# Patient Record
Sex: Female | Born: 1975 | Race: White | Hispanic: No | Marital: Single | Smoking: Never smoker
Health system: Southern US, Community
[De-identification: ages and names within clinical notes are randomized; demographics above are authoritative.]

## PROBLEM LIST (undated history)

## (undated) DIAGNOSIS — I10 Essential (primary) hypertension: Secondary | ICD-10-CM

## (undated) DIAGNOSIS — IMO0002 Reserved for concepts with insufficient information to code with codable children: Secondary | ICD-10-CM

## (undated) DIAGNOSIS — M329 Systemic lupus erythematosus, unspecified: Secondary | ICD-10-CM

---

## 2004-07-17 ENCOUNTER — Emergency Department: Payer: Self-pay | Admitting: Internal Medicine

## 2006-12-15 ENCOUNTER — Emergency Department: Payer: Self-pay | Admitting: Emergency Medicine

## 2007-06-14 ENCOUNTER — Emergency Department: Payer: Self-pay | Admitting: Emergency Medicine

## 2007-06-16 ENCOUNTER — Ambulatory Visit: Payer: Self-pay | Admitting: General Surgery

## 2007-06-20 ENCOUNTER — Ambulatory Visit: Payer: Self-pay | Admitting: General Surgery

## 2007-09-08 ENCOUNTER — Emergency Department: Payer: Self-pay | Admitting: Emergency Medicine

## 2007-09-14 ENCOUNTER — Emergency Department: Payer: Self-pay | Admitting: Emergency Medicine

## 2007-10-07 ENCOUNTER — Emergency Department: Payer: Self-pay | Admitting: Emergency Medicine

## 2008-03-07 ENCOUNTER — Observation Stay: Payer: Self-pay

## 2008-03-13 ENCOUNTER — Observation Stay: Payer: Self-pay | Admitting: Obstetrics and Gynecology

## 2008-05-06 ENCOUNTER — Ambulatory Visit: Payer: Self-pay | Admitting: Obstetrics and Gynecology

## 2008-05-07 ENCOUNTER — Inpatient Hospital Stay: Payer: Self-pay | Admitting: Obstetrics and Gynecology

## 2008-10-09 ENCOUNTER — Ambulatory Visit: Payer: Self-pay | Admitting: Family Medicine

## 2009-07-16 ENCOUNTER — Emergency Department: Payer: Self-pay | Admitting: Emergency Medicine

## 2011-12-08 ENCOUNTER — Ambulatory Visit: Payer: Self-pay | Admitting: Internal Medicine

## 2013-06-20 ENCOUNTER — Ambulatory Visit: Payer: Self-pay | Admitting: Physician Assistant

## 2014-10-28 ENCOUNTER — Ambulatory Visit: Payer: Self-pay | Admitting: Family Medicine

## 2014-11-05 ENCOUNTER — Ambulatory Visit: Payer: Self-pay | Admitting: Family Medicine

## 2014-11-25 ENCOUNTER — Ambulatory Visit: Payer: Self-pay | Admitting: Family Medicine

## 2014-12-24 ENCOUNTER — Ambulatory Visit: Payer: Self-pay | Admitting: Physician Assistant

## 2016-02-09 ENCOUNTER — Other Ambulatory Visit: Payer: Self-pay | Admitting: Family Medicine

## 2016-02-09 DIAGNOSIS — Z1231 Encounter for screening mammogram for malignant neoplasm of breast: Secondary | ICD-10-CM

## 2016-02-12 ENCOUNTER — Ambulatory Visit
Admission: RE | Admit: 2016-02-12 | Discharge: 2016-02-12 | Disposition: A | Discharge status: Home / Self Care | Payer: BLUE CROSS/BLUE SHIELD | Source: Ambulatory Visit | Attending: Family Medicine | Admitting: Family Medicine

## 2016-02-12 DIAGNOSIS — Z1231 Encounter for screening mammogram for malignant neoplasm of breast: Secondary | ICD-10-CM

## 2016-03-10 ENCOUNTER — Other Ambulatory Visit: Payer: Self-pay | Admitting: Family Medicine

## 2016-03-10 DIAGNOSIS — R928 Other abnormal and inconclusive findings on diagnostic imaging of breast: Secondary | ICD-10-CM

## 2016-03-19 ENCOUNTER — Other Ambulatory Visit: Payer: BLUE CROSS/BLUE SHIELD

## 2016-03-19 ENCOUNTER — Ambulatory Visit: Payer: BLUE CROSS/BLUE SHIELD

## 2016-04-05 ENCOUNTER — Ambulatory Visit
Admission: RE | Admit: 2016-04-05 | Discharge: 2016-04-05 | Disposition: A | Discharge status: Home / Self Care | Payer: BLUE CROSS/BLUE SHIELD | Source: Ambulatory Visit | Attending: Family Medicine | Admitting: Family Medicine

## 2016-04-05 DIAGNOSIS — R928 Other abnormal and inconclusive findings on diagnostic imaging of breast: Secondary | ICD-10-CM

## 2017-12-13 ENCOUNTER — Other Ambulatory Visit: Payer: Self-pay | Admitting: Family Medicine

## 2017-12-13 DIAGNOSIS — Z1231 Encounter for screening mammogram for malignant neoplasm of breast: Secondary | ICD-10-CM

## 2017-12-20 ENCOUNTER — Ambulatory Visit
Admission: RE | Admit: 2017-12-20 | Discharge: 2017-12-20 | Disposition: A | Discharge status: Home / Self Care | Payer: BLUE CROSS/BLUE SHIELD | Source: Ambulatory Visit | Attending: Family Medicine | Admitting: Family Medicine

## 2017-12-20 DIAGNOSIS — R928 Other abnormal and inconclusive findings on diagnostic imaging of breast: Secondary | ICD-10-CM | POA: Diagnosis not present

## 2017-12-20 DIAGNOSIS — Z1231 Encounter for screening mammogram for malignant neoplasm of breast: Secondary | ICD-10-CM | POA: Diagnosis present

## 2017-12-27 ENCOUNTER — Other Ambulatory Visit: Payer: Self-pay | Admitting: Family Medicine

## 2017-12-27 DIAGNOSIS — N6489 Other specified disorders of breast: Secondary | ICD-10-CM

## 2017-12-27 DIAGNOSIS — R928 Other abnormal and inconclusive findings on diagnostic imaging of breast: Secondary | ICD-10-CM

## 2018-01-10 ENCOUNTER — Ambulatory Visit
Admission: RE | Admit: 2018-01-10 | Discharge: 2018-01-10 | Disposition: A | Discharge status: Home / Self Care | Payer: BLUE CROSS/BLUE SHIELD | Source: Ambulatory Visit | Attending: Family Medicine | Admitting: Family Medicine

## 2018-01-10 DIAGNOSIS — R928 Other abnormal and inconclusive findings on diagnostic imaging of breast: Secondary | ICD-10-CM | POA: Diagnosis present

## 2018-01-10 DIAGNOSIS — N6489 Other specified disorders of breast: Secondary | ICD-10-CM

## 2019-11-16 ENCOUNTER — Ambulatory Visit
Admission: EM | Admit: 2019-11-16 | Discharge: 2019-11-16 | Disposition: A | Discharge status: Home / Self Care | Payer: BC Managed Care – PPO | Attending: Urgent Care | Admitting: Urgent Care

## 2019-11-16 ENCOUNTER — Other Ambulatory Visit: Payer: Self-pay

## 2019-11-16 ENCOUNTER — Encounter: Payer: Self-pay | Admitting: Emergency Medicine

## 2019-11-16 DIAGNOSIS — H66003 Acute suppurative otitis media without spontaneous rupture of ear drum, bilateral: Secondary | ICD-10-CM

## 2019-11-16 DIAGNOSIS — R059 Cough, unspecified: Secondary | ICD-10-CM

## 2019-11-16 DIAGNOSIS — R05 Cough: Secondary | ICD-10-CM

## 2019-11-16 HISTORY — DX: Reserved for concepts with insufficient information to code with codable children: IMO0002

## 2019-11-16 HISTORY — DX: Systemic lupus erythematosus, unspecified: M32.9

## 2019-11-16 MED ORDER — BENZONATATE 200 MG PO CAPS: 200.0000 mg | ORAL_CAPSULE | Freq: Three times a day (TID) | ORAL | 0 refills | Status: DC | PRN

## 2019-11-16 MED ORDER — AMOXICILLIN-POT CLAVULANATE 875-125 MG PO TABS: 1.0000 | ORAL_TABLET | Freq: Two times a day (BID) | ORAL | 0 refills | Status: AC

## 2019-11-16 NOTE — Discharge Instructions (Addendum)
It was very nice seeing you today in clinic. Thank you for entrusting me with your care.   Rest and Stay HYDRATED. Water and electrolyte containing beverages (Gatorade, Pedialyte) are best to prevent dehydration and electrolyte abnormalities. Use medications as prescribed. May use Tylenol and/or Ibuprofen as needed for pain/fever.   Make arrangements to follow up with your regular doctor in 1 week for re-evaluation if not improving. If your symptoms/condition worsens, please seek follow up care either here or in the ER. Please remember, our [place] [facility] providers are "right here with you" when you need us.   Again, it was my pleasure to take care of you today. Thank you for choosing our clinic. I hope that you start to feel better quickly.   Leevi Cullars, MSN, APRN, FNP-C, CEN Advanced Practice Provider [facility] [place] [facility] 

## 2019-11-16 NOTE — ED Triage Notes (Addendum)
Patient c/o left ear pain that started yesterday. Patient c/o cough for a week.  Patient was tested for COVID and was negative.  Patient denies fevers.

## 2019-11-17 NOTE — ED Provider Notes (Signed)
[place], [place]   Name: Melissa Best DOB: 07-01-1976 MRN: [id number] CSN: [id number] PCP: Rayetta Humphrey, MD  Arrival date and time:  11/16/19 1505  Chief Complaint:  Otalgia (left) and Cough   NOTE: Prior to seeing the patient today, I have reviewed the triage nursing documentation and vital signs. Clinical staff has updated patient's PMH/PSHx, current medication list, and drug allergies/intolerances to ensure comprehensive history available to assist in medical decision making.   History:   HPI: Melissa Best is a 44 y.o. female who presents today with complaints of fatigue, cough, upper back pain, generalized headache, and LEFT sided otalgia that started approximately 1 week ago. Patient denies fevers.  She has not experienced any otorrhea or changes to her hearing.  Pain in her ears is worse on the left side as compared to the right.  She is complaining of pain in the LEFT side of her neck.  Cough has been non-productive with no associated shortness of breath or wheezing.  Cough is reportedly significantly worse at night. She denies that she has experienced any nausea, vomiting, diarrhea, or abdominal pain. She is eating and drinking well. Patient denies any perceived alterations to her sense of taste or smell. Patient denies being in close contact with anyone known to be ill; no one else is her home has experienced a similar symptom constellation. She has been tested for SARS-CoV-2 (novel coronavirus) in the past 14 days; last tested negative on 11/07/2019 per her report. Patient has not been vaccinated for influenza this season. In efforts to conservatively manage her symptoms at home, the patient notes that she has used naproxen, which has not helped to improve her symptoms.    Past Medical History:  Diagnosis Date  . Lupus [facility])     Past Surgical History:  Procedure Laterality Date  . CESAREAN SECTION      Family History  Problem Relation Age of  Onset  . Healthy Mother   . Healthy Father   . Breast cancer Neg Hx     Social History   Tobacco Use  . Smoking status: Never Smoker  . Smokeless tobacco: Never Used  Substance Use Topics  . Alcohol use: Never  . Drug use: Never    There are no problems to display for this patient.   Home Medications:    Current Meds  Medication Sig  . Calcium Carbonate-Vitamin D 600-200 MG-UNIT TABS Take by mouth.  . cetirizine (ZYRTEC) 10 MG tablet Take by mouth.  . cyanocobalamin 1000 MCG tablet Take by mouth.  . hydrochlorothiazide (HYDRODIURIL) 25 MG tablet Take by mouth.  . hydroxychloroquine (PLAQUENIL) 200 MG tablet Take by mouth.  Marland Kitchen ipratropium (ATROVENT) 0.03 % nasal spray Place into the nose.  . naproxen (NAPROSYN) 500 MG tablet Take by mouth.  . SUMAtriptan (IMITREX) 50 MG tablet TAKE 1 TABLET BY MOUTH ONCE AS NEEDED FOR MIGRAINE. MAY TAKE A 2ND DOSE AFTER 2 HOURS IF NEEDED.    Allergies:   Hydrocodone and Other  Review of Systems (ROS): Review of Systems  Constitutional: Positive for fatigue. Negative for fever.  HENT: Positive for ear pain. Negative for congestion, postnasal drip, rhinorrhea, sinus pressure, sinus pain, sneezing and sore throat.   Eyes: Negative for pain, discharge and redness.  Respiratory: Negative for cough, chest tightness and shortness of breath.   Cardiovascular: Negative for chest pain and palpitations.  Gastrointestinal: Negative for abdominal pain, diarrhea, nausea and vomiting.  Musculoskeletal: Positive for back pain. Negative for arthralgias,  myalgias and neck pain.  Skin: Negative for color change, pallor and rash.  Allergic/Immunologic: Positive for immunocompromised state (Lupus).  Neurological: Positive for headaches. Negative for dizziness, syncope and weakness.  Hematological: Negative for adenopathy.     Vital Signs: Today's Vitals   11/16/19 1515 11/16/19 1519 11/16/19 1550  BP:  (S) (!) 183/107   Pulse:  74   Resp:  14     Temp:  97.8 F (36.6 C)   TempSrc:  Oral   SpO2:  100%   Weight: 145 lb (65.8 kg)    Height: 4\' 11"  (1.499 m)    PainSc: 9   9     Physical Exam: Physical Exam  Constitutional: She is oriented to person, place, and time and well-developed, well-nourished, and in no distress.  HENT:  Head: Normocephalic and atraumatic.  Right Ear: There is tenderness. Tympanic membrane is erythematous. A middle ear effusion (suppurative) is present.  Left Ear: There is tenderness. Tympanic membrane is erythematous. A middle ear effusion (suppurative) is present.  Mouth/Throat: Uvula is midline and mucous membranes are normal. Posterior oropharyngeal erythema present. No oropharyngeal exudate or posterior oropharyngeal edema.  Eyes: Pupils are equal, round, and reactive to light.  Cardiovascular: Normal rate, regular rhythm, normal heart sounds and intact distal pulses.  Pulmonary/Chest: Effort normal and breath sounds normal.  Significant cough noted in clinic. No SOB or increased WOB. No distress. Able to speak in complete sentences without difficulties. SPO2 100% on RA  Musculoskeletal:     Cervical back: Pain (2/2 coughing; reproduciable with coughing and deep inspiration) present.       Back:  Lymphadenopathy:       Head (left side): Submandibular adenopathy present.  Neurological: She is alert and oriented to person, place, and time. Gait normal.  Skin: Skin is warm and dry. No rash noted. She is not diaphoretic.  Psychiatric: Mood, memory, affect and judgment normal.  Nursing note and vitals reviewed.   Urgent Care Treatments / Results:   No orders of the defined types were placed in this encounter.   LABS: PLEASE NOTE: all labs that were ordered this encounter are listed, however only abnormal results are displayed. Labs Reviewed - No data to display  EKG: -None  RADIOLOGY: No results found.  PROCEDURES: Procedures  MEDICATIONS RECEIVED THIS VISIT: Medications - No data to  display  PERTINENT CLINICAL COURSE NOTES/UPDATES:   Initial Impression / Assessment and Plan / Urgent Care Course:  Pertinent labs & imaging results that were available during my care of the patient were personally reviewed by me and considered in my medical decision making (see lab/imaging section of note for values and interpretations).  Melissa Best is a 44 y.o. female who presents to [facility] today with complaints of Otalgia (left) and Cough  Patient overall well appearing and in no acute distress today in clinic. Presenting symptoms (see HPI) and exam as documented above.  Symptoms persistent for the last week despite conservative management at home.  Patient has been recently tested for SARS-CoV-2 6 11/07/2019) and the results were negative.  Patient does not wish to be retested today despite education regarding possibility of potential infection.  Exam reveals AOME and patient's BILATERAL ears.  Will proceed with treatment using a 10-day course of amoxicillin-clavulanate. Discussed supportive care measures at home during acute phase of illness. Patient to rest as much as possible. She was encouraged to ensure adequate hydration (water and ORS) to prevent dehydration and electrolyte derangements. Patient  may use APAP and/or IBU on an as needed basis for pain/fever.  Patient's cough is significant, and she is experiencing pleuritic chest pain associated with certain movements and deep inspiration.  Initially discussed sending a prescription for Tussionex cough syrup to help with both her cough and chest discomfort, however has an unclear allergy to hydrocodone, thus I will defer. Will send in a supply of benzonatate (Tessalon) for patient to use on a PRN basis to help with her cough.   Current clinical condition warrants patient being out of work in order to recover from current illness. She was provided with the appropriate documentation to provide to her place of employment  that will allow for her to RTW on 11/19/2019 with no restrictions.  Patient made aware that her RTW may be contingent on her providing documentation of a negative SARS-CoV-2 test.  Again, patient continues to decline retesting today.  Discussed follow up with primary care physician in 1 week for re-evaluation. I have reviewed the follow up and strict return precautions for any new or worsening symptoms. Patient is aware of symptoms that would be deemed urgent/emergent, and would thus require further evaluation either here or in the emergency department. At the time of discharge, she verbalized understanding and consent with the discharge plan as it was reviewed with her. All questions were fielded by provider and/or clinic staff prior to patient discharge.    Final Clinical Impressions / Urgent Care Diagnoses:   Final diagnoses:  Non-recurrent acute suppurative otitis media of both ears without spontaneous rupture of tympanic membranes  Cough    New Prescriptions:  [place] Controlled Substance Registry consulted? Not Applicable  Meds ordered this encounter  Medications  . amoxicillin-clavulanate (AUGMENTIN) 875-125 MG tablet    Sig: Take 1 tablet by mouth 2 (two) times daily for 10 days.    Dispense:  20 tablet    Refill:  0  . benzonatate (TESSALON) 200 MG capsule    Sig: Take 1 capsule (200 mg total) by mouth 3 (three) times daily as needed for cough.    Dispense:  15 capsule    Refill:  0    Recommended Follow up Care:  Patient encouraged to follow up with the following provider within the specified time frame, or sooner as dictated by the severity of her symptoms. As always, she was instructed that for any urgent/emergent care needs, she should seek care either here or in the emergency department for more immediate evaluation.  Follow-up Information    Rayetta Humphrey, MD In 1 week.   Specialty: Family Medicine Why: General reassessment of symptoms if not improving Contact  information: [address] [place] [address] [phone]         NOTE: This note was prepared using Dragon dictation software along with smaller phrase technology. Despite my best ability to proofread, there is the potential that transcriptional errors may still occur from this process, and are completely unintentional.    Verlee Monte, NP 11/17/19 1745

## 2020-01-16 ENCOUNTER — Other Ambulatory Visit: Payer: Self-pay | Admitting: Family Medicine

## 2020-01-16 DIAGNOSIS — R1084 Generalized abdominal pain: Secondary | ICD-10-CM

## 2020-01-16 DIAGNOSIS — R1011 Right upper quadrant pain: Secondary | ICD-10-CM

## 2020-06-25 ENCOUNTER — Other Ambulatory Visit: Payer: Self-pay | Admitting: Family Medicine

## 2020-06-25 DIAGNOSIS — Z1231 Encounter for screening mammogram for malignant neoplasm of breast: Secondary | ICD-10-CM

## 2020-07-03 ENCOUNTER — Other Ambulatory Visit: Payer: Self-pay

## 2020-07-03 ENCOUNTER — Ambulatory Visit
Admission: RE | Admit: 2020-07-03 | Discharge: 2020-07-03 | Disposition: A | Discharge status: Home / Self Care | Payer: BC Managed Care – PPO | Source: Ambulatory Visit | Attending: Family Medicine | Admitting: Family Medicine

## 2020-07-03 DIAGNOSIS — Z1231 Encounter for screening mammogram for malignant neoplasm of breast: Secondary | ICD-10-CM | POA: Diagnosis not present

## 2020-07-09 ENCOUNTER — Other Ambulatory Visit: Payer: Self-pay | Admitting: Family Medicine

## 2020-07-09 DIAGNOSIS — N6489 Other specified disorders of breast: Secondary | ICD-10-CM

## 2020-07-09 DIAGNOSIS — R928 Other abnormal and inconclusive findings on diagnostic imaging of breast: Secondary | ICD-10-CM

## 2020-08-06 ENCOUNTER — Telehealth: Payer: Self-pay | Admitting: *Deleted

## 2020-08-06 NOTE — Telephone Encounter (Signed)
NO RESPONSE FROM PT TO [facility] AV/ MAMMO - CERTIFIED LETTER SENT

## 2020-10-16 ENCOUNTER — Other Ambulatory Visit: Payer: Self-pay

## 2020-10-16 ENCOUNTER — Encounter: Payer: Self-pay | Admitting: Emergency Medicine

## 2020-10-16 ENCOUNTER — Ambulatory Visit
Admission: EM | Admit: 2020-10-16 | Discharge: 2020-10-16 | Disposition: A | Discharge status: Home / Self Care | Payer: BC Managed Care – PPO | Attending: Physician Assistant | Admitting: Physician Assistant

## 2020-10-16 DIAGNOSIS — H9202 Otalgia, left ear: Secondary | ICD-10-CM | POA: Diagnosis present

## 2020-10-16 DIAGNOSIS — R059 Cough, unspecified: Secondary | ICD-10-CM | POA: Insufficient documentation

## 2020-10-16 DIAGNOSIS — J0140 Acute pansinusitis, unspecified: Secondary | ICD-10-CM | POA: Insufficient documentation

## 2020-10-16 DIAGNOSIS — Z20822 Contact with and (suspected) exposure to covid-19: Secondary | ICD-10-CM | POA: Insufficient documentation

## 2020-10-16 DIAGNOSIS — I10 Essential (primary) hypertension: Secondary | ICD-10-CM | POA: Insufficient documentation

## 2020-10-16 MED ORDER — AMOXICILLIN-POT CLAVULANATE 875-125 MG PO TABS: 1.0000 | ORAL_TABLET | Freq: Two times a day (BID) | ORAL | 0 refills | Status: AC

## 2020-10-16 MED ORDER — BENZONATATE 200 MG PO CAPS: 200.0000 mg | ORAL_CAPSULE | Freq: Three times a day (TID) | ORAL | 0 refills | Status: DC | PRN

## 2020-10-16 NOTE — ED Triage Notes (Signed)
Patient c/o nasal congestion, cough and chest congestion that started 10/11/20.  Patient denies fevers.

## 2020-10-16 NOTE — Discharge Instructions (Addendum)
Recommend Augmentin 875mg  (antibiotic) twice a day as directed. May take Tessalon cough pills 1 every 8 hours as needed for cough. Continue Naproxen 500mg  every 12 hours as needed for pain. May also take OTC Tylenol (Acetaminophen) 1000mg  every 8 hours as needed for pain. Continue to increase fluids to help loosen up mucus in sinuses. Follow-up pending COVID 19 test results and in 4 to 5 days if not improving.

## 2020-10-16 NOTE — ED Provider Notes (Signed)
[facility]    CSN: [id number] Arrival date & time: 10/16/20  0949      History   Chief Complaint Chief Complaint  Patient presents with  . Cough  . Nasal Congestion    HPI Melissa Best is a 45 y.o. female.   45 year old female presents with nasal congestion, fatigue, and dry cough that started about 5 to 6 days ago. Over the past 2 to 3 days, symptoms have worsened with left sided sinus pain and pressure, maxillary sinus swelling, bloody nose and left ear pain. Also having a sore throat and chills but denies any known fever or GI symptoms. Has taken OTC sinus, Mucinex and Naproxen with minimal relief. Multiple people sick and positive for COVID 19 at her work. Other chronic health issues include HTN, environmental allergies, migraine headaches and Lupus. Currently on HCTZ, Zyrtec, Atrovent nasal spray, and Plaquenil daily and Imitrex prn.   The history is provided by the patient.    Past Medical History:  Diagnosis Date  . Lupus (HCC)     There are no problems to display for this patient.   Past Surgical History:  Procedure Laterality Date  . CESAREAN SECTION      OB History   No obstetric history on file.      Home Medications    Prior to Admission medications   Medication Sig Start Date End Date Taking? Authorizing Provider  amoxicillin-clavulanate (AUGMENTIN) 875-125 MG tablet Take 1 tablet by mouth every 12 (twelve) hours for 7 days. 10/16/20 10/23/20 Yes Elridge Stemm, Ali Lowe, NP  Calcium Carbonate-Vitamin D 600-200 MG-UNIT TABS Take by mouth. 05/17/19 10/16/20 Yes [provider]  cetirizine (ZYRTEC) 10 MG tablet Take by mouth. 11/28/18 10/16/20 Yes [provider]  cyanocobalamin 1000 MCG tablet Take by mouth. 01/31/19  Yes [provider]  hydrochlorothiazide (HYDRODIURIL) 25 MG tablet Take by mouth. 05/29/19 10/16/20 Yes [provider]  hydroxychloroquine (PLAQUENIL) 200 MG tablet Take by mouth. 05/17/19   Yes [provider]  SUMAtriptan (IMITREX) 50 MG tablet TAKE 1 TABLET BY MOUTH ONCE AS NEEDED FOR MIGRAINE. MAY TAKE A 2ND DOSE AFTER 2 HOURS IF NEEDED. 05/29/19  Yes [provider]  benzonatate (TESSALON) 200 MG capsule Take 1 capsule (200 mg total) by mouth 3 (three) times daily as needed for cough. 10/16/20   Sudie Grumbling, NP  ipratropium (ATROVENT) 0.03 % nasal spray Place into the nose. 08/29/19 08/28/20  [provider]  naproxen (NAPROSYN) 500 MG tablet Take by mouth. 10/09/19   [provider]    Family History Family History  Problem Relation Age of Onset  . Healthy Mother   . Healthy Father   . Breast cancer Neg Hx     Social History Social History   Tobacco Use  . Smoking status: Never Smoker  . Smokeless tobacco: Never Used  Vaping Use  . Vaping Use: Never used  Substance Use Topics  . Alcohol use: Never  . Drug use: Never     Allergies   Hydrocodone and Other   Review of Systems Review of Systems  Constitutional: Positive for appetite change, chills and fatigue. Negative for activity change and fever.  HENT: Positive for congestion, ear pain (left), facial swelling (left), nosebleeds, postnasal drip, rhinorrhea, sinus pressure, sinus pain and sore throat. Negative for ear discharge, mouth sores and trouble swallowing.   Eyes: Negative for pain, redness and itching.  Respiratory: Positive for cough. Negative for chest tightness, shortness of  breath and wheezing.   Gastrointestinal: Negative for nausea and vomiting.  Musculoskeletal: Positive for arthralgias, myalgias and neck pain (left side). Negative for neck stiffness.  Skin: Negative for color change and rash.  Allergic/Immunologic: Positive for environmental allergies. Negative for food allergies and immunocompromised state.  Neurological: Positive for headaches. Negative for dizziness, tremors, seizures, syncope, speech difficulty, weakness and numbness.   Hematological: Negative for adenopathy. Does not bruise/bleed easily.     Physical Exam Triage Vital Signs ED Triage Vitals  Enc Vitals Group     BP 10/16/20 1009 (!) 144/104     Pulse Rate 10/16/20 1009 71     Resp 10/16/20 1009 14     Temp 10/16/20 1009 98.4 F (36.9 C)     Temp Source 10/16/20 1009 Oral     SpO2 10/16/20 1009 100 %     Weight 10/16/20 1006 146 lb (66.2 kg)     Height 10/16/20 1006 4\' 11"  (1.499 m)     Head Circumference --      Peak Flow --      Pain Score 10/16/20 1005 8     Pain Loc --      Pain Edu? --      Excl. in GC? --    No data found.  Updated Vital Signs BP (!) 144/104 (BP Location: Left Arm)   Pulse 71   Temp 98.4 F (36.9 C) (Oral)   Resp 14   Ht 4\' 11"  (1.499 m)   Wt 146 lb (66.2 kg)   LMP 10/04/2020 (Exact Date)   SpO2 100%   BMI 29.49 kg/m   Visual Acuity Right Eye Distance:   Left Eye Distance:   Bilateral Distance:    Right Eye Near:   Left Eye Near:    Bilateral Near:     Physical Exam Vitals and nursing note reviewed.  Constitutional:      General: She is awake. She is not in acute distress.    Appearance: She is well-developed and well-groomed. She is ill-appearing.     Comments: She is sitting in the exam chair in no acute distress but appears tired, ill and in pain.   HENT:     Head: Normocephalic and atraumatic.     Right Ear: Hearing, ear canal and external ear normal. No drainage. No middle ear effusion. Tympanic membrane is bulging. Tympanic membrane is not injected or erythematous.     Left Ear: Hearing, ear canal and external ear normal. No drainage. A middle ear effusion is present. Tympanic membrane is injected, erythematous and bulging.     Nose: Mucosal edema and congestion present.     Left Nostril: Epistaxis (dried blood present- left side) present.     Right Sinus: No maxillary sinus tenderness or frontal sinus tenderness.     Left Sinus: Maxillary sinus tenderness and frontal sinus tenderness  present.     Mouth/Throat:     Lips: Pink.     Mouth: Mucous membranes are moist.     Pharynx: Uvula midline. Posterior oropharyngeal erythema present. No pharyngeal swelling or uvula swelling.  Eyes:     Extraocular Movements: Extraocular movements intact.     Conjunctiva/sclera: Conjunctivae normal.  Cardiovascular:     Rate and Rhythm: Normal rate and regular rhythm.     Heart sounds: Normal heart sounds. No murmur heard.   Pulmonary:     Effort: Pulmonary effort is normal. No respiratory distress.     Breath sounds: Normal breath sounds and air  entry. No decreased air movement. No decreased breath sounds, wheezing, rhonchi or rales.  Musculoskeletal:     Cervical back: Normal range of motion and neck supple. No rigidity.  Lymphadenopathy:     Head:     Right side of head: No tonsillar adenopathy.     Left side of head: Tonsillar adenopathy present.     Cervical: No cervical adenopathy.  Skin:    General: Skin is warm and dry.     Capillary Refill: Capillary refill takes less than 2 seconds.  Neurological:     General: No focal deficit present.     Mental Status: She is alert and oriented to person, place, and time.  Psychiatric:        Mood and Affect: Mood normal.        Behavior: Behavior normal. Behavior is cooperative.        Thought Content: Thought content normal.        Judgment: Judgment normal.      UC Treatments / Results  Labs (all labs ordered are listed, but only abnormal results are displayed) Labs Reviewed  SARS CORONAVIRUS 2 (TAT 6-24 HRS)    EKG   Radiology No results found.  Procedures Procedures (including critical care time)  Medications Ordered in UC Medications - No data to display  Initial Impression / Assessment and Plan / UC Course  I have reviewed the triage vital signs and the nursing notes.  Pertinent labs & imaging results that were available during my care of the patient were reviewed by me and considered in my medical  decision making (see chart for details).    Reviewed with patient that she probably has a sinus infection- mainly left maxillary and frontal sinuses. She also has a mild left ear infection. Will start Augmentin 875mg  twice a day as directed. May also take Tessalon cough pills 1 every 8 hours as needed. Increase fluids to help loosen up mucus in sinuses. Continue Naproxen every 12 hours as needed for pain. May also take Tylenol 1000mg  every 8 hours as needed for break-through pain. Rest. Stay at home. Note written for work. Also briefly discussed elevated blood pressure- continue to take medication- may be elevated today due to illness and pain. Continue to monitor and call your PCP if BP remains >140/>90. Follow-up pending COVID 19 test results and in 4 to 5 days if not improving.  Final Clinical Impressions(s) / UC Diagnoses   Final diagnoses:  Acute non-recurrent pansinusitis  Cough  Acute ear pain, left  Elevated blood pressure reading with diagnosis of hypertension     Discharge Instructions     Recommend Augmentin 875mg  (antibiotic) twice a day as directed. May take Tessalon cough pills 1 every 8 hours as needed for cough. Continue Naproxen 500mg  every 12 hours as needed for pain. May also take OTC Tylenol (Acetaminophen) 1000mg  every 8 hours as needed for pain. Continue to increase fluids to help loosen up mucus in sinuses. Follow-up pending COVID 19 test results and in 4 to 5 days if not improving.     ED Prescriptions    Medication Sig Dispense Auth. Provider   benzonatate (TESSALON) 200 MG capsule Take 1 capsule (200 mg total) by mouth 3 (three) times daily as needed for cough. 21 capsule Sudie GrumblingAmyot, Douglass Dunshee Berry, NP   amoxicillin-clavulanate (AUGMENTIN) 875-125 MG tablet Take 1 tablet by mouth every 12 (twelve) hours for 7 days. 14 tablet Saran Laviolette, Ali LoweAnn Berry, NP     PDMP not reviewed this  encounter.   Sudie Grumbling, NP 10/16/20 2108

## 2020-10-17 LAB — SARS CORONAVIRUS 2 (TAT 6-24 HRS): SARS Coronavirus 2: NEGATIVE

## 2020-11-04 ENCOUNTER — Ambulatory Visit
Admission: EM | Admit: 2020-11-04 | Discharge: 2020-11-04 | Disposition: A | Discharge status: Home / Self Care | Payer: BC Managed Care – PPO | Attending: Family Medicine | Admitting: Family Medicine

## 2020-11-04 ENCOUNTER — Other Ambulatory Visit: Payer: Self-pay

## 2020-11-04 ENCOUNTER — Encounter: Payer: Self-pay | Admitting: Emergency Medicine

## 2020-11-04 DIAGNOSIS — U071 COVID-19: Secondary | ICD-10-CM | POA: Diagnosis not present

## 2020-11-04 DIAGNOSIS — J029 Acute pharyngitis, unspecified: Secondary | ICD-10-CM | POA: Insufficient documentation

## 2020-11-04 DIAGNOSIS — R059 Cough, unspecified: Secondary | ICD-10-CM | POA: Diagnosis not present

## 2020-11-04 DIAGNOSIS — Z20822 Contact with and (suspected) exposure to covid-19: Secondary | ICD-10-CM

## 2020-11-04 MED ORDER — BENZONATATE 200 MG PO CAPS: 200.0000 mg | ORAL_CAPSULE | Freq: Three times a day (TID) | ORAL | 0 refills | Status: AC | PRN

## 2020-11-04 MED ORDER — MAGIC MOUTHWASH: 10.0000 mL | Freq: Four times a day (QID) | ORAL | 0 refills | Status: DC | PRN

## 2020-11-04 MED ORDER — MAGIC MOUTHWASH: 10.0000 mL | Freq: Four times a day (QID) | ORAL | 0 refills | Status: AC | PRN

## 2020-11-04 NOTE — ED Triage Notes (Signed)
Patient c/o cough and sore throat that started yesterday. Her son is positive for COVID.

## 2020-11-04 NOTE — Discharge Instructions (Signed)
Recommend restart Tessalon cough pills 1 every 8 hours as needed for cough. Continue Naproxen 500mg  every 12 hours as needed for throat pain. May also use Magic Mouthwash- swish and spit out or swallow every 6 hours as needed for throat pain. Rest. Stay at home. Follow-up pending COVID 19 test results.

## 2020-11-04 NOTE — ED Provider Notes (Signed)
[facility]    CSN: [id number] Arrival date & time: 11/04/20  1036      History   Chief Complaint Chief Complaint  Patient presents with  . Cough  . Sore Throat    HPI Melissa Best is a 45 y.o. female.   45 year old female presents with sore throat and cough that started yesterday. Today having some chills, body aches and runny nose. Denies any fever or GI symptoms. Son is sick and got tested yesterday and is positive for COVID 19. She has taken Zyrtec and Tylenol with some relief. Took Naproxen last night which helped her sore throat but has not taken any this morning. Went to work this morning but was sent home. Was seen here at Urgent Care about 3 weeks ago for sinus infection (negative COVID 19)- finished Augmentin and symptoms had completely resolved. Other chronic health issues include Lupus, migraine headaches and environmental allergies. Currently on Plaquenil, vit B12, Zyrtec daily and Imitrex prn.   The history is provided by the patient.    Past Medical History:  Diagnosis Date  . Lupus (HCC)     There are no problems to display for this patient.   Past Surgical History:  Procedure Laterality Date  . CESAREAN SECTION      OB History   No obstetric history on file.      Home Medications    Prior to Admission medications   Medication Sig Start Date End Date Taking? Authorizing Provider  cyanocobalamin 1000 MCG tablet Take by mouth. 01/31/19  Yes [provider]  hydroxychloroquine (PLAQUENIL) 200 MG tablet Take by mouth. 05/17/19  Yes [provider]  naproxen (NAPROSYN) 500 MG tablet Take by mouth. 10/09/19  Yes [provider]  SUMAtriptan (IMITREX) 50 MG tablet TAKE 1 TABLET BY MOUTH ONCE AS NEEDED FOR MIGRAINE. MAY TAKE A 2ND DOSE AFTER 2 HOURS IF NEEDED. 05/29/19  Yes [provider]  benzonatate (TESSALON) 200 MG capsule Take 1 capsule (200 mg total) by mouth 3 (three) times daily as needed  for cough. 11/04/20   Sudie Grumbling, NP  Calcium Carbonate-Vitamin D 600-200 MG-UNIT TABS Take by mouth. 05/17/19 10/16/20  [provider]  cetirizine (ZYRTEC) 10 MG tablet Take by mouth. 11/28/18 10/16/20  [provider]  hydrochlorothiazide (HYDRODIURIL) 25 MG tablet Take by mouth. 05/29/19 10/16/20  [provider]  ipratropium (ATROVENT) 0.03 % nasal spray Place into the nose. 08/29/19 08/28/20  [provider]  magic mouthwash SOLN Take 10 mLs by mouth 4 (four) times daily as needed (throat pain). 11/04/20   Sudie Grumbling, NP    Family History Family History  Problem Relation Age of Onset  . Healthy Mother   . Healthy Father   . Breast cancer Neg Hx     Social History Social History   Tobacco Use  . Smoking status: Never Smoker  . Smokeless tobacco: Never Used  Vaping Use  . Vaping Use: Never used  Substance Use Topics  . Alcohol use: Never  . Drug use: Never     Allergies   Hydrocodone and Other   Review of Systems Review of Systems  Constitutional: Positive for chills and fatigue. Negative for activity change, appetite change and fever.  HENT: Positive for postnasal drip, rhinorrhea and sore throat. Negative for congestion, ear discharge, ear pain, facial swelling, mouth sores, nosebleeds, sinus pressure, sinus pain and trouble swallowing.   Eyes: Negative for pain, discharge, redness and itching.  Respiratory: Positive for cough (dry). Negative for chest tightness, shortness of breath and wheezing.   Gastrointestinal: Negative for nausea and vomiting.  Musculoskeletal: Positive for arthralgias and myalgias. Negative for neck stiffness.  Skin: Negative for color change and rash.  Allergic/Immunologic: Positive for environmental allergies. Negative for food allergies and immunocompromised state.  Neurological: Positive for headaches. Negative for dizziness, seizures, syncope, speech difficulty, weakness and light-headedness.   Hematological: Negative for adenopathy. Does not bruise/bleed easily.     Physical Exam Triage Vital Signs ED Triage Vitals  Enc Vitals Group     BP 11/04/20 1054 119/90     Pulse Rate 11/04/20 1054 97     Resp 11/04/20 1054 18     Temp 11/04/20 1054 98.8 F (37.1 C)     Temp Source 11/04/20 1054 Oral     SpO2 11/04/20 1054 100 %     Weight 11/04/20 1052 146 lb (66.2 kg)     Height 11/04/20 1052 4\' 11"  (1.499 m)     Head Circumference --      Peak Flow --      Pain Score 11/04/20 1052 0     Pain Loc --      Pain Edu? --      Excl. in GC? --    No data found.  Updated Vital Signs BP 119/90 (BP Location: Right Arm)   Pulse 97   Temp 98.8 F (37.1 C) (Oral)   Resp 18   Ht 4\' 11"  (1.499 m)   Wt 146 lb (66.2 kg)   LMP 11/02/2020   SpO2 100%   BMI 29.49 kg/m   Visual Acuity Right Eye Distance:   Left Eye Distance:   Bilateral Distance:    Right Eye Near:   Left Eye Near:    Bilateral Near:     Physical Exam Vitals and nursing note reviewed.  Constitutional:      General: She is awake. She is not in acute distress.    Appearance: She is well-developed and well-groomed.     Comments: She is sitting on the exam table in no acute distress but appears tired.   HENT:     Head: Normocephalic and atraumatic.     Right Ear: Hearing, tympanic membrane, ear canal and external ear normal.     Left Ear: Hearing, tympanic membrane, ear canal and external ear normal.     Nose: Rhinorrhea present. Rhinorrhea is clear.     Right Sinus: No maxillary sinus tenderness or frontal sinus tenderness.     Left Sinus: No maxillary sinus tenderness or frontal sinus tenderness.     Mouth/Throat:     Lips: Pink.     Mouth: Mucous membranes are moist.     Pharynx: Uvula midline. Posterior oropharyngeal erythema present. No pharyngeal swelling, oropharyngeal exudate or uvula swelling.  Eyes:     Extraocular Movements: Extraocular movements intact.     Conjunctiva/sclera: Conjunctivae  normal.  Cardiovascular:     Rate and Rhythm: Normal rate and regular rhythm.     Heart sounds: Normal heart sounds. No murmur heard.   Pulmonary:     Effort: Pulmonary effort is normal. No respiratory distress.     Breath sounds: Normal breath sounds and air entry. No decreased air movement. No decreased breath sounds, wheezing, rhonchi or rales.  Musculoskeletal:     Cervical back: Normal range of motion and neck supple.  Lymphadenopathy:     Cervical: Cervical adenopathy present.     Right cervical:  No superficial cervical adenopathy.    Left cervical: Superficial cervical adenopathy present.  Skin:    General: Skin is warm and dry.     Capillary Refill: Capillary refill takes less than 2 seconds.     Findings: No rash.  Neurological:     General: No focal deficit present.     Mental Status: She is alert and oriented to person, place, and time.  Psychiatric:        Mood and Affect: Mood normal.        Behavior: Behavior normal. Behavior is cooperative.        Thought Content: Thought content normal.        Judgment: Judgment normal.      UC Treatments / Results  Labs (all labs ordered are listed, but only abnormal results are displayed) Labs Reviewed  SARS CORONAVIRUS 2 (TAT 6-24 HRS)    EKG   Radiology No results found.  Procedures Procedures (including critical care time)  Medications Ordered in UC Medications - No data to display  Initial Impression / Assessment and Plan / UC Course  I have reviewed the triage vital signs and the nursing notes.  Pertinent labs & imaging results that were available during my care of the patient were reviewed by me and considered in my medical decision making (see chart for details).    Reviewed with patient that she probably has a viral illness- most likely COVID 19. Recommend restart the Tessalon cough pills 1 every 8 hours as needed. Continue Naproxen 500mg  every 12 hours as needed for throat pain. Patient requests  similar mouth wash that son was prescribed by his health care provider- thought it was a "white, thick liquid". Recommend trial Magic Mouthwash- equal parts Maalox, Nystatin and Benadryl- take 2 teaspoons swish and spit out or swallow every 6 hours as needed for throat pain. (Had to rewrite Rx with appropriate directions for pharmacy). Rest. Stay at home. Note written for work. Follow-up pending COVID 19 test results.   Final Clinical Impressions(s) / UC Diagnoses   Final diagnoses:  Sore throat  Cough  Exposure to COVID-19 virus     Discharge Instructions     Recommend restart Tessalon cough pills 1 every 8 hours as needed for cough. Continue Naproxen 500mg  every 12 hours as needed for throat pain. May also use Magic Mouthwash- swish and spit out or swallow every 6 hours as needed for throat pain. Rest. Stay at home. Follow-up pending COVID 19 test results.     ED Prescriptions    Medication Sig Dispense Auth. Provider   benzonatate (TESSALON) 200 MG capsule Take 1 capsule (200 mg total) by mouth 3 (three) times daily as needed for cough. 21 capsule , NP   magic mouthwash SOLN  (Status: Discontinued) Take 10 mLs by mouth 4 (four) times daily as needed (throat pain). 120 mL , NP   magic mouthwash SOLN Take 10 mLs by mouth 4 (four) times daily as needed (throat pain). 120 mL Sudie Grumbling, NP     PDMP not reviewed this encounter.   Sudie Grumbling, NP 11/04/20 [phone]

## 2020-11-05 LAB — SARS CORONAVIRUS 2 (TAT 6-24 HRS): SARS Coronavirus 2: POSITIVE — AB

## 2020-11-06 ENCOUNTER — Telehealth (HOSPITAL_COMMUNITY): Payer: Self-pay | Admitting: Family

## 2020-11-06 ENCOUNTER — Other Ambulatory Visit: Payer: Self-pay | Admitting: Family

## 2020-11-06 ENCOUNTER — Telehealth: Payer: Self-pay

## 2020-11-06 DIAGNOSIS — U071 COVID-19: Secondary | ICD-10-CM

## 2020-11-06 MED ORDER — MOLNUPIRAVIR EUA 200MG CAPSULE: 4.0000 | ORAL_CAPSULE | Freq: Two times a day (BID) | ORAL | 0 refills | Status: AC

## 2020-11-06 NOTE — Telephone Encounter (Signed)
Called to discuss with patient about COVID-19 symptoms and the use of one of the available treatments for those with mild to moderate Covid symptoms and at a high risk of hospitalization.  Pt appears to qualify for outpatient treatment due to co-morbid conditions and/or a member of an at-risk group in accordance with the FDA Emergency Use Authorization.    Symptom onset: 11/03/20 per chart Vaccinated: Yes Booster? No Immunocompromised? Yes Qualifiers: Lupus  Unable to reach pt - Unable to leave message.  Esther Hardy

## 2020-11-06 NOTE — Telephone Encounter (Signed)
Outpatient Oral COVID Treatment Note  I connected with Melissa Best on 11/06/2020/8:10 PM by telephone and verified that I am speaking with the correct person using two identifiers.  I discussed the limitations, risks, security, and privacy concerns of performing an evaluation and management service by telephone and the availability of in person appointments. I also discussed with the patient that there may be a patient responsible charge related to this service. The patient expressed understanding and agreed to proceed.  Patient location: Home Provider location: Work  Diagnosis: COVID-19 infection  Purpose of visit: Discussion of potential use of Molnupiravir or Paxlovid, a new treatment for mild to moderate COVID-19 viral infection in non-hospitalized patients.   Subjective: Patient is a 45 y.o. female who has been diagnosed with COVID 19 viral infection.  Their symptoms began on 11/04/2020 with headache, cough, sore throat, fever.    Past Medical History:  Diagnosis Date  . Lupus (HCC)     Allergies  Allergen Reactions  . Hydrocodone Anaphylaxis, Nausea Only and Other (See Comments)    Drowsiness as well. Pt states "I almost died."   . Other Itching     Current Outpatient Medications:  .  benzonatate (TESSALON) 200 MG capsule, Take 1 capsule (200 mg total) by mouth 3 (three) times daily as needed for cough., Disp: 21 capsule, Rfl: 0 .  Calcium Carbonate-Vitamin D 600-200 MG-UNIT TABS, Take by mouth., Disp: , Rfl:  .  cetirizine (ZYRTEC) 10 MG tablet, Take by mouth., Disp: , Rfl:  .  cyanocobalamin 1000 MCG tablet, Take by mouth., Disp: , Rfl:  .  hydrochlorothiazide (HYDRODIURIL) 25 MG tablet, Take by mouth., Disp: , Rfl:  .  hydroxychloroquine (PLAQUENIL) 200 MG tablet, Take by mouth., Disp: , Rfl:  .  ipratropium (ATROVENT) 0.03 % nasal spray, Place into the nose., Disp: , Rfl:  .  magic mouthwash SOLN, Take 10 mLs by mouth 4 (four) times daily as needed (throat  pain)., Disp: 120 mL, Rfl: 0 .  naproxen (NAPROSYN) 500 MG tablet, Take by mouth., Disp: , Rfl:  .  SUMAtriptan (IMITREX) 50 MG tablet, TAKE 1 TABLET BY MOUTH ONCE AS NEEDED FOR MIGRAINE. MAY TAKE A 2ND DOSE AFTER 2 HOURS IF NEEDED., Disp: , Rfl:   Objective: Patient sounds to be in no apparent distress.  Breathing is non labored.  Mood and behavior are normal.  Laboratory Data:  Recent Results (from the past 2160 hour(s))  SARS CORONAVIRUS 2 (TAT 6-24 HRS) Nasopharyngeal Nasopharyngeal Swab     Status: None   Collection Time: 10/16/20 10:11 AM   Specimen: Nasopharyngeal Swab  Result Value Ref Range   SARS Coronavirus 2 NEGATIVE NEGATIVE    Comment: (NOTE) SARS-CoV-2 target nucleic acids are NOT DETECTED.  The SARS-CoV-2 RNA is generally detectable in upper and lower respiratory specimens during the acute phase of infection. Negative results do not preclude SARS-CoV-2 infection, do not rule out co-infections with other pathogens, and should not be used as the sole basis for treatment or other patient management decisions. Negative results must be combined with clinical observations, patient history, and epidemiological information. The expected result is Negative.  Fact Sheet for Patients: HairSlick.no  Fact Sheet for Healthcare Providers: quierodirigir.com  This test is not yet approved or cleared by the [place] FDA and  has been authorized for detection and/or diagnosis of SARS-CoV-2 by FDA under an Emergency Use Authorization (EUA). This EUA will remain  in effect (meaning this test can be used) for the duration  of the COVID-19 declaration under Se ction 564(b)(1) of the Act, 21 U.S.C. section 360bbb-3(b)(1), unless the authorization is terminated or revoked sooner.  Performed at [facility], [address] [place], [place] [address]   SARS CORONAVIRUS 2 (TAT 6-24 HRS) Nasopharyngeal Nasopharyngeal Swab      Status: Abnormal   Collection Time: 11/04/20 10:55 AM   Specimen: Nasopharyngeal Swab  Result Value Ref Range   SARS Coronavirus 2 POSITIVE (A) NEGATIVE    Comment: (NOTE) SARS-CoV-2 target nucleic acids are DETECTED.  The SARS-CoV-2 RNA is generally detectable in upper and lower respiratory specimens during the acute phase of infection. Positive results are indicative of the presence of SARS-CoV-2 RNA. Clinical correlation with patient history and other diagnostic information is  necessary to determine patient infection status. Positive results do not rule out bacterial infection or co-infection with other viruses.  The expected result is Negative.  Fact Sheet for Patients: HairSlick.no  Fact Sheet for Healthcare Providers: quierodirigir.com  This test is not yet approved or cleared by the [place] FDA and  has been authorized for detection and/or diagnosis of SARS-CoV-2 by FDA under an Emergency Use Authorization (EUA). This EUA will remain  in effect (meaning this test can be used) for the duration of the COVID-19 declaration under Section 564(b)(1) of the Act, 21 U. S.C. section 360bbb-3(b)(1), unless the authorization is terminated or revoked sooner.   Performed at [facility], [address] [place], [place] [address]      Assessment: 45 y.o. female with mild/moderate COVID 19 viral infection diagnosed on 11/05/2020 at high risk for progression to severe COVID 19. Discussed concerns for pregnancy. She states she is not pregnant and understands the concerns and recommendations, and states she will not become pregnant. Discussed IV medication since she is vaccinated but not boosted. She declines IV medication treatments due to cost.   Plan:  This patient is a 45 y.o. female that meets the following criteria for Emergency Use Authorization of: Molnupiravir  1. Age >18 yr 2. SARS-COV-2 positive  test 3. Symptom onset < 5 days 4. Mild-to-moderate COVID disease with high risk for severe progression to hospitalization or death   I have spoken and communicated the following to the patient or parent/caregiver regarding: 1. Molnupiravir is an unapproved drug that is authorized for use under an TEFL teacher.  2. There are no adequate, approved, available products for the treatment of COVID-19 in adults who have mild-to-moderate COVID-19 and are at high risk for progressing to severe COVID-19, including hospitalization or death. 3. Other therapeutics are currently authorized. For additional information on all products authorized for treatment or prevention of COVID-19, please see https://www.graham-miller.com/.  4. There are benefits and risks of taking this treatment as outlined in the "Fact Sheet for Patients and Caregivers."  5. "Fact Sheet for Patients and Caregivers" was reviewed with patient. A hard copy will be provided to patient from pharmacy prior to the patient receiving treatment. 6. Patients should continue to self-isolate and use infection control measures (e.g., wear mask, isolate, social distance, avoid sharing personal items, clean and disinfect "high touch" surfaces, and frequent handwashing) according to CDC guidelines.  7. The patient or parent/caregiver has the option to accept or refuse treatment. 8. [employer] has established a pregnancy surveillance program. 9. Females of childbearing potential should use a reliable method of contraception correctly and consistently, as applicable, for the duration of treatment and for 4 days after the last  dose of Molnupiravir. 10. Males of reproductive potential who are sexually active with females of childbearing potential should use a reliable method of contraception correctly and consistently during treatment and for at  least 3 months after the last dose. 11. Pregnancy status and risk was assessed. Patient verbalized understanding of precautions.   After reviewing above information with the patient, the patient agrees to receive molnupiravir.  Follow up instructions:    . Take prescription BID x 5 days as directed . Reach out to pharmacist for counseling on medication if desired . For concerns regarding further COVID symptoms please follow up with your PCP or urgent care . For urgent or life-threatening issues, seek care at your local emergency department  The patient was provided an opportunity to ask questions, and all were answered. The patient agreed with the plan and demonstrated an understanding of the instructions.   Script sent to [facility] and opted to pick up RX.  The patient was advised to call their PCP or seek an in-person evaluation if the symptoms worsen or if the condition fails to improve as anticipated.   I provided 20 minutes of non face-to-face telephone visit time during this encounter, and > 50% was spent counseling as documented under my assessment & plan.  Morton Stall, NP 11/06/2020 /8:10 PM

## 2020-11-10 ENCOUNTER — Telehealth (HOSPITAL_COMMUNITY): Payer: Self-pay | Admitting: Emergency Medicine

## 2020-11-10 NOTE — Telephone Encounter (Signed)
Patient c/o continued fatigue, body aches and self reported fever.  Per Dr. Leonides Grills, okay to extend work note.  Updated and sent via Mychart

## 2020-11-13 ENCOUNTER — Ambulatory Visit
Admission: EM | Admit: 2020-11-13 | Discharge: 2020-11-13 | Disposition: A | Discharge status: Home / Self Care | Payer: BC Managed Care – PPO | Attending: Physician Assistant | Admitting: Physician Assistant

## 2020-11-13 ENCOUNTER — Ambulatory Visit (INDEPENDENT_AMBULATORY_CARE_PROVIDER_SITE_OTHER): Payer: BC Managed Care – PPO

## 2020-11-13 ENCOUNTER — Other Ambulatory Visit: Payer: Self-pay

## 2020-11-13 DIAGNOSIS — R5383 Other fatigue: Secondary | ICD-10-CM

## 2020-11-13 DIAGNOSIS — R059 Cough, unspecified: Secondary | ICD-10-CM | POA: Diagnosis not present

## 2020-11-13 DIAGNOSIS — Z8616 Personal history of COVID-19: Secondary | ICD-10-CM | POA: Diagnosis not present

## 2020-11-13 DIAGNOSIS — I10 Essential (primary) hypertension: Secondary | ICD-10-CM | POA: Diagnosis not present

## 2020-11-13 DIAGNOSIS — R058 Other specified cough: Secondary | ICD-10-CM | POA: Diagnosis not present

## 2020-11-13 DIAGNOSIS — U071 COVID-19: Secondary | ICD-10-CM

## 2020-11-13 MED ORDER — PROMETHAZINE-DM 6.25-15 MG/5ML PO SYRP: 5.0000 mL | ORAL_SOLUTION | Freq: Every evening | ORAL | 0 refills | Status: AC | PRN

## 2020-11-13 NOTE — ED Triage Notes (Signed)
Covid+ 11/06/20 Pt reports having productive cough and back pain due to cough. sts she have been taking meds for the cough without relief.

## 2020-11-13 NOTE — ED Provider Notes (Signed)
[facility]    CSN: [id number] Arrival date & time: 11/13/20  0808      History   Chief Complaint Chief Complaint  Patient presents with  . Cough    HPI Melissa Best is a 45 y.o. female presenting for continued cough, fatigue, and nasal congestion. Also admits to headaches and body aches. She was diagnosed with COVID 19 through positive PCR test at [facility] 9 days ago. Symptoms began the day prior. Cough is occasionally productive.  Patient denies worsening of symptoms, but does not feel like she is improved.  She denies any fever, sore throat, chest pain, breathing difficulty/wheezing, abdominal pain, nausea/vomiting/diarrhea.  Patient has been taking OTC cough medication (Dayquil and Nyquil) without improvement in cough. She was prescribed molnupiravir (FDA authorized emergency use antiviral treatment for those at risk for complications from COVID). Patient has been fully vaccinated for COVID 19. PMH is significant for Lupus and hypertension.  Patient takes Plaquenil and Cozaar.  No history of cardiopulmonary disease.  No history of DVT or PE.  Patient states that she has been out of work for the past 9 to 10 days and this was to go back to work today, but has not been like she is ready to return says she cannot stop coughing.  She requests a work note.  HPI  Past Medical History:  Diagnosis Date  . Lupus (HCC)     There are no problems to display for this patient.   Past Surgical History:  Procedure Laterality Date  . CESAREAN SECTION      OB History   No obstetric history on file.      Home Medications    Prior to Admission medications   Medication Sig Start Date End Date Taking? Authorizing Provider  promethazine-dextromethorphan (PROMETHAZINE-DM) 6.25-15 MG/5ML syrup Take 5 mLs by mouth at bedtime as needed for up to 10 days for cough. 11/13/20 11/23/20 Yes Shirlee Latch, PA-C  benzonatate (TESSALON) 200 MG capsule Take 1 capsule (200 mg  total) by mouth 3 (three) times daily as needed for cough. 11/04/20   Sudie Grumbling, NP  Calcium Carbonate-Vitamin D 600-200 MG-UNIT TABS Take by mouth. 05/17/19 10/16/20  [provider]  cetirizine (ZYRTEC) 10 MG tablet Take by mouth. 11/28/18 10/16/20  [provider]  cyanocobalamin 1000 MCG tablet Take by mouth. 01/31/19   [provider]  hydrochlorothiazide (HYDRODIURIL) 25 MG tablet Take by mouth. 05/29/19 10/16/20  [provider]  hydroxychloroquine (PLAQUENIL) 200 MG tablet Take by mouth. 05/17/19   [provider]  ipratropium (ATROVENT) 0.03 % nasal spray Place into the nose. 08/29/19 08/28/20  [provider]  magic mouthwash SOLN Take 10 mLs by mouth 4 (four) times daily as needed (throat pain). 11/04/20   Sudie Grumbling, NP  naproxen (NAPROSYN) 500 MG tablet Take by mouth. 10/09/19   [provider]  SUMAtriptan (IMITREX) 50 MG tablet TAKE 1 TABLET BY MOUTH ONCE AS NEEDED FOR MIGRAINE. MAY TAKE A 2ND DOSE AFTER 2 HOURS IF NEEDED. 05/29/19   [provider]    Family History Family History  Problem Relation Age of Onset  . Healthy Mother   . Healthy Father   . Breast cancer Neg Hx     Social History Social History   Tobacco Use  . Smoking status: Never Smoker  . Smokeless tobacco: Never Used  Vaping Use  . Vaping Use: Never used  Substance Use Topics  . Alcohol use: Never  .  Drug use: Never     Allergies   Hydrocodone and Other   Review of Systems Review of Systems  Constitutional: Positive for fatigue. Negative for chills, diaphoresis and fever.  HENT: Positive for congestion, postnasal drip and rhinorrhea. Negative for ear pain, sinus pressure, sinus pain and sore throat.   Respiratory: Positive for cough. Negative for shortness of breath and wheezing.   Cardiovascular: Negative for chest pain and palpitations.  Gastrointestinal: Negative for abdominal pain, nausea and vomiting.   Musculoskeletal: Positive for myalgias. Negative for arthralgias.  Skin: Negative for rash.  Neurological: Positive for headaches. Negative for weakness.  Hematological: Negative for adenopathy.     Physical Exam Triage Vital Signs ED Triage Vitals  Enc Vitals Group     BP 11/13/20 0821 (!) 186/116     Pulse Rate 11/13/20 0821 (!) 110     Resp 11/13/20 0821 18     Temp 11/13/20 0821 98.6 F (37 C)     Temp Source 11/13/20 0821 Oral     SpO2 11/13/20 0821 100 %     Weight 11/13/20 0822 145 lb 15.1 oz (66.2 kg)     Height 11/13/20 0822 4\' 11"  (1.499 m)     Head Circumference --      Peak Flow --      Pain Score 11/13/20 0821 6     Pain Loc --      Pain Edu? --      Excl. in GC? --    No data found.  Updated Vital Signs BP (!) 159/106   Pulse (!) 114   Temp 98.6 F (37 C) (Oral)   Resp 18   Ht 4\' 11"  (1.499 m)   Wt 145 lb 15.1 oz (66.2 kg)   LMP 11/02/2020   SpO2 100%   BMI 29.48 kg/m      Physical Exam Vitals and nursing note reviewed.  Constitutional:      General: She is not in acute distress.    Appearance: Normal appearance. She is ill-appearing. She is not toxic-appearing or diaphoretic.  HENT:     Head: Normocephalic and atraumatic.     Right Ear: Tympanic membrane, ear canal and external ear normal.     Left Ear: Tympanic membrane, ear canal and external ear normal.     Nose: Congestion and rhinorrhea (moderate clear drainage) present.     Mouth/Throat:     Mouth: Mucous membranes are moist.     Pharynx: Oropharynx is clear. Posterior oropharyngeal erythema (mild with post nasal drainage) present.  Eyes:     General: No scleral icterus.       Right eye: No discharge.        Left eye: No discharge.     Conjunctiva/sclera: Conjunctivae normal.  Cardiovascular:     Rate and Rhythm: Regular rhythm. Tachycardia present.     Heart sounds: Normal heart sounds.  Pulmonary:     Effort: Pulmonary effort is normal. No respiratory distress.     Breath  sounds: Normal breath sounds. No wheezing, rhonchi or rales.  Musculoskeletal:     Cervical back: Neck supple.     Right lower leg: No edema.     Left lower leg: No edema.  Skin:    General: Skin is dry.  Neurological:     General: No focal deficit present.     Mental Status: She is alert. Mental status is at baseline.     Motor: No weakness.     Gait: Gait  normal.  Psychiatric:        Mood and Affect: Mood normal.        Behavior: Behavior normal.        Thought Content: Thought content normal.      UC Treatments / Results  Labs (all labs ordered are listed, but only abnormal results are displayed) Labs Reviewed - No data to display  EKG   Radiology DG Chest 2 View  Result Date: 11/13/2020 CLINICAL DATA:  History of COVID-19 positivity with persistent cough, initial encounter EXAM: CHEST - 2 VIEW COMPARISON:  10/09/2008 FINDINGS: The heart size and mediastinal contours are within normal limits. Both lungs are clear. The visualized skeletal structures are unremarkable. IMPRESSION: No active cardiopulmonary disease. Electronically Signed   By: Alcide Clever M.D.   On: 11/13/2020 08:38    Procedures Procedures (including critical care time)  Medications Ordered in UC Medications - No data to display  Initial Impression / Assessment and Plan / UC Course  I have reviewed the triage vital signs and the nursing notes.  Pertinent labs & imaging results that were available during my care of the patient were reviewed by me and considered in my medical decision making (see chart for details).    45 year old female presenting for COVID symptoms of cough, fatigue, congestion and body aches x10 days.  Patient states no improvement in symptoms despite taking over-the-counter cough medication and Tessalon Perles.  Patient has also completed 5-day course of Molnupiravir.  Additionally, she is fully vaccinated for COVID-19.  She denies any fever, chest pain, or breathing difficulty.  In  the clinic, patient is ill-appearing, but nontoxic. BP elevated in clinic to 186/116 and HR 110. Patient consistently coughing during vitals check. She does have HTN and takes Cozar for management. Currently taking OTC decongestants which can certainly raise pressure.   Exam significant for nasal congestion and rhinorrhea as well as posterior pharyngeal erythema with postnasal drainage.  Her chest is clear to auscultation and heart regular rhythm with tachycardic rate.  Chest X-ray performed and independently reviewed by me. X-ray normal. Overread shows no evidence of active cardiopulmonary disease.  Advised patient of course of COVID-19 and explained that sometimes patients can experience prolonged symptoms especially fatigue and cough.  Advised her to stop the over-the-counter cough medications that she is taking and switch to Coricidin HBP and increasing rest and fluids.  Also consider use of humidifier.  I did prescribe Promethazine DM for her to take at night to assist with sleep.  I am afraid that if she takes this during the day will increase her blood pressure too much.  ED precautions discussed with patient.  I did advise her that she is at a bit of an increased risk for PE and DVT due to the fact that she has COVID-19 and also lupus.  Discussed red flag signs and symptoms including fever, chest pain, shortness of breath, worsening back pain or increased fatigue, or syncope/presyncope.  Advised her if that any of these things happen or her cough is not better in the next few days and she should go to the ED for evaluation.  She is not displaying any of these signs or symptoms at this point and concern is low.  Work note provided for patient and advised continuing supportive care and taking the medication as prescribed.  Follow-up with our department as needed.  Patient understanding and agreeable.   Final Clinical Impressions(s) / UC Diagnoses   Final diagnoses:  COVID-19  Cough  Fatigue,  unspecified type     Discharge Instructions     Your chest x-ray is clear today.  There is no sign of pneumonia.  As discussed, sometimes people can experience continued COVID symptoms for a couple of weeks.  The most common symptoms that persist are cough and fatigue.  Over-the-counter cough medications seem to have raise your blood pressure quite a bit so you should start taking Coricidin HBP during the day and drinking plenty of fluids.  I have sent Promethazine DM for you to take at nighttime.  Make sure you are increasing rest and hydration.  Consider use of humidifier in your bedroom at night.  Have given you a work note for the next 3 days.  If you feel better before that you can return.  If you are still not feeling better next week or if any symptoms worsen, especially if you develop increased back pain, you have chest pain, you have breathing difficulty, increased fatigue/weakness you should go to the emergency department.  As discussed, you are at higher risk for blood clot due to the fact that you have lupus and COVID-19.  Are not overly concerned about that at this point because you are not complaining of any chest pain and her breathing is normal.  But, if symptoms worsen or still not getting better in a few days you should go to the ED.    ED Prescriptions    Medication Sig Dispense Auth. Provider   promethazine-dextromethorphan (PROMETHAZINE-DM) 6.25-15 MG/5ML syrup Take 5 mLs by mouth at bedtime as needed for up to 10 days for cough. 50 mL Shirlee Latch, PA-C     I have reviewed the PDMP during this encounter.   Shirlee Latch, PA-C 11/13/20 [phone]

## 2020-11-13 NOTE — Discharge Instructions (Signed)
Your chest x-ray is clear today.  There is no sign of pneumonia.  As discussed, sometimes people can experience continued COVID symptoms for a couple of weeks.  The most common symptoms that persist are cough and fatigue.  Over-the-counter cough medications seem to have raise your blood pressure quite a bit so you should start taking Coricidin HBP during the day and drinking plenty of fluids.  I have sent Promethazine DM for you to take at nighttime.  Make sure you are increasing rest and hydration.  Consider use of humidifier in your bedroom at night.  Have given you a work note for the next 3 days.  If you feel better before that you can return.  If you are still not feeling better next week or if any symptoms worsen, especially if you develop increased back pain, you have chest pain, you have breathing difficulty, increased fatigue/weakness you should go to the emergency department.  As discussed, you are at higher risk for blood clot due to the fact that you have lupus and COVID-19.  Are not overly concerned about that at this point because you are not complaining of any chest pain and her breathing is normal.  But, if symptoms worsen or still not getting better in a few days you should go to the ED.

## 2021-06-01 ENCOUNTER — Other Ambulatory Visit: Payer: Self-pay

## 2021-06-01 ENCOUNTER — Ambulatory Visit
Admission: EM | Admit: 2021-06-01 | Discharge: 2021-06-01 | Disposition: A | Discharge status: Home / Self Care | Payer: BC Managed Care – PPO | Attending: Physician Assistant | Admitting: Physician Assistant

## 2021-06-01 ENCOUNTER — Encounter: Payer: Self-pay | Admitting: Emergency Medicine

## 2021-06-01 DIAGNOSIS — M542 Cervicalgia: Secondary | ICD-10-CM | POA: Insufficient documentation

## 2021-06-01 DIAGNOSIS — R110 Nausea: Secondary | ICD-10-CM | POA: Insufficient documentation

## 2021-06-01 DIAGNOSIS — Z79899 Other long term (current) drug therapy: Secondary | ICD-10-CM | POA: Diagnosis not present

## 2021-06-01 DIAGNOSIS — M79606 Pain in leg, unspecified: Secondary | ICD-10-CM | POA: Diagnosis not present

## 2021-06-01 DIAGNOSIS — Z20822 Contact with and (suspected) exposure to covid-19: Secondary | ICD-10-CM | POA: Insufficient documentation

## 2021-06-01 DIAGNOSIS — B349 Viral infection, unspecified: Secondary | ICD-10-CM | POA: Insufficient documentation

## 2021-06-01 DIAGNOSIS — J029 Acute pharyngitis, unspecified: Secondary | ICD-10-CM | POA: Insufficient documentation

## 2021-06-01 DIAGNOSIS — R519 Headache, unspecified: Secondary | ICD-10-CM | POA: Insufficient documentation

## 2021-06-01 DIAGNOSIS — R109 Unspecified abdominal pain: Secondary | ICD-10-CM | POA: Insufficient documentation

## 2021-06-01 DIAGNOSIS — R21 Rash and other nonspecific skin eruption: Secondary | ICD-10-CM | POA: Diagnosis not present

## 2021-06-01 LAB — RESP PANEL BY RT-PCR (FLU A&B, COVID) ARPGX2
Influenza A by PCR: NEGATIVE
Influenza B by PCR: NEGATIVE
SARS Coronavirus 2 by RT PCR: NEGATIVE

## 2021-06-01 LAB — GROUP A STREP BY PCR: Group A Strep by PCR: NOT DETECTED

## 2021-06-01 MED ORDER — ONDANSETRON 8 MG PO TBDP: 8.0000 mg | ORAL_TABLET | Freq: Three times a day (TID) | ORAL | 0 refills | Status: AC | PRN

## 2021-06-01 MED ORDER — ACETAMINOPHEN 500 MG PO TABS
1000.0000 mg | ORAL_TABLET | Freq: Once | ORAL | Status: AC
  Administered 2021-06-01: 1000 mg via ORAL

## 2021-06-01 NOTE — ED Triage Notes (Signed)
Pt c/o body aches, headache, sore throat, chills, fatigue. Started about 3 days ago. She states she did a covid test at home and was negative.

## 2021-06-01 NOTE — ED Provider Notes (Signed)
[facility]    CSN: [id number] Arrival date & time: 06/01/21  0940      History   Chief Complaint Chief Complaint  Patient presents with   Generalized Body Aches    HPI Nupur Hohman is a 45 y.o. female.   Patient is a 45 year old female who presents with chief complaint of diffuse body aches, sore throat, headache and fatigue that began on Friday.  Patient states he is also got a rash that she first noted yesterday.  Patient states pain in her neck, upper her lower extremity, and abdomen.  She also reports some nausea.  Patient states she feels like her muscles in her hands and her legs are tight.  She states she was so fatigued when she went to [facility] yesterday she was unable to push the cart.  Patient states she has been taking naproxen without any relief.  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  Patient has been vaccinated for COVID with a booster.  Patient denies any sick contacts at home but states a coworker has been sick recently but she is not sure what is going on with her.   Past Medical History:  Diagnosis Date   Lupus (HCC)     There are no problems to display for this patient.   Past Surgical History:  Procedure Laterality Date   CESAREAN SECTION      OB History   No obstetric history on file.      Home Medications    Prior to Admission medications   Medication Sig Start Date End Date Taking? Authorizing Provider  Calcium Carbonate-Vitamin D 600-200 MG-UNIT TABS Take by mouth. 05/17/19 06/01/21 Yes [provider]  cetirizine (ZYRTEC) 10 MG tablet Take by mouth. 11/28/18 06/01/21 Yes [provider]  cyanocobalamin 1000 MCG tablet Take by mouth. 01/31/19  Yes [provider]  hydrochlorothiazide (HYDRODIURIL) 25 MG tablet Take by mouth. 05/29/19 06/01/21 Yes [provider]  hydroxychloroquine (PLAQUENIL) 200 MG tablet Take by mouth. 05/17/19  Yes [provider]  ondansetron (ZOFRAN ODT) 8 MG disintegrating tablet Take 1 tablet (8 mg total) by mouth every 8 (eight) hours as needed for nausea or vomiting. 06/01/21  Yes Candis Schatz, PA-C  pantoprazole (PROTONIX) 40 MG tablet Take by mouth. 09/02/20 09/02/21 Yes [provider]  SUMAtriptan (IMITREX) 50 MG tablet TAKE 1 TABLET BY MOUTH ONCE AS NEEDED FOR MIGRAINE. MAY TAKE A 2ND DOSE AFTER 2 HOURS IF NEEDED. 05/29/19  Yes [provider]  benzonatate (TESSALON) 200 MG capsule Take 1 capsule (200 mg total) by mouth 3 (three) times daily as needed for cough. 11/04/20   Sudie Grumbling, NP  ipratropium (ATROVENT) 0.03 % nasal spray Place into the nose. 08/29/19 08/28/20  [provider]  magic mouthwash SOLN Take 10 mLs by mouth 4 (four) times daily as needed (throat pain). 11/04/20   Sudie Grumbling, NP  Molnupiravir 200 MG CAPS TAKE 4 CAPSULES (800 MG TOTAL) BY MOUTH 2 (TWO) TIMES DAILY FOR 5 DAYS. 11/06/20 11/06/21  Morton Stall, NP  naproxen (NAPROSYN) 500 MG tablet Take by mouth. 10/09/19   [provider]    Family History Family History  Problem Relation Age of Onset   Healthy Mother    Healthy Father    Breast cancer Neg Hx     Social History Social History   Tobacco Use   Smoking status: Never   Smokeless tobacco: Never  Vaping Use   Vaping Use: Never used  Substance Use Topics   Alcohol use: Never   Drug use: Never     Allergies   Hydrocodone and Other   Review of Systems Review of Systems as noted above in HPI.  Other systems reviewed and found be negative   Physical Exam Triage Vital Signs ED Triage Vitals  Enc Vitals Group     BP 06/01/21 1048 (!) 135/95     Pulse Rate 06/01/21 1048 99     Resp 06/01/21 1048 18     Temp 06/01/21 1048 99.6 F (37.6 C)     Temp Source 06/01/21 1048 Oral     SpO2 06/01/21 1048 100 %     Weight 06/01/21 1049 145 lb 15.1 oz (66.2 kg)     Height 06/01/21 1049 4\' 11"  (1.499 m)     Head  Circumference --      Peak Flow --      Pain Score 06/01/21 1049 9     Pain Loc --      Pain Edu? --      Excl. in GC? --    No data found.  Updated Vital Signs BP (!) 135/95 (BP Location: Left Arm)   Pulse 99   Temp 99.6 F (37.6 C) (Oral)   Resp 18   Ht 4\' 11"  (1.499 m)   Wt 145 lb 15.1 oz (66.2 kg)   LMP 05/18/2021 (Approximate)   SpO2 100%   BMI 29.48 kg/m   Visual Acuity Right Eye Distance:   Left Eye Distance:   Bilateral Distance:    Right Eye Near:   Left Eye Near:    Bilateral Near:     Physical Exam Constitutional:      Appearance: Normal appearance.  HENT:     Head: Normocephalic and atraumatic.     Right Ear: A middle ear effusion is present. Tympanic membrane is not injected or erythematous.     Left Ear: A middle ear effusion is present. Tympanic membrane is erythematous. Tympanic membrane is not injected.     Nose:     Right Sinus: No maxillary sinus tenderness or frontal sinus tenderness.     Left Sinus: No maxillary sinus tenderness or frontal sinus tenderness.     Mouth/Throat:     Mouth: Mucous membranes are moist.     Pharynx: Oropharynx is clear. Uvula midline. No posterior oropharyngeal erythema.     Tonsils: No tonsillar exudate.  Cardiovascular:     Rate and Rhythm: Normal rate and regular rhythm.  Musculoskeletal:        General: Normal range of motion.     Cervical back: Tenderness (to palpation paraspinal muscles) present.  Skin:    General: Skin is warm and dry.     Findings: Rash (minimal diffuse rash to legs arms, and trunk.) present.  Neurological:     General: No focal deficit present.     Mental Status: She is alert and oriented to person, place, and time.     UC Treatments / Results  Labs (all labs ordered are listed, but only abnormal results are displayed) Labs Reviewed  RESP PANEL BY RT-PCR (FLU A&B, COVID) [id number]  GROUP A STREP BY PCR    EKG   Radiology No results found.  Procedures Procedures (including  critical care time)  Medications Ordered in UC Medications  acetaminophen (TYLENOL) tablet 1,000 mg (1,000 mg Oral Given 06/01/21 1136)    Initial Impression / Assessment and  Plan / UC Course  I have reviewed the triage vital signs and the nursing notes.  Pertinent labs & imaging results that were available during my care of the patient were reviewed by me and considered in my medical decision making (see chart for details).    Patient presents with diffuse body aches, sore throat, headache, abdominal pain with nausea.  Exam also shows bilateral ear effusions.  Minimal diffuse rash that is pink and papular with no excoriation marks noted.  Patient without any sick contacts at home reports sick contact at work but unsure what she has. Flu and COVID swabs were negative.  Patient also had a negative COVID test at home.  She does have concern for viral prodrome given her symptoms.  She does have a rash he does work as a Chief Strategy Officer at [employer].  We will send her home with recommendations for symptom management.  Have her return to clinic should these rash areas worsen or become vesicular.  Also have her follow-up with her PCP as needed. Final Clinical Impressions(s) / UC Diagnoses   Final diagnoses:  Viral syndrome     Discharge Instructions      -COVID and flu test were negative. -Symptoms overall consistent with a viral illness.  Treatment is symptom management.  Ibuprofen and Tylenol as needed for pain and headaches.  Plenty of fluids. -Zofran: one tablet under the tongue as needed for nausea -Return to the clinic or follow-up with primary care provider should symptoms worsen or not improve. -Would also return to clinic should the rash worsen or become fluid filled.     ED Prescriptions     Medication Sig Dispense Auth. Provider   ondansetron (ZOFRAN ODT) 8 MG disintegrating tablet Take 1 tablet (8 mg total) by mouth every 8 (eight) hours as needed for nausea or vomiting. 20  tablet Candis Schatz, PA-C      PDMP not reviewed this encounter.   Candis Schatz, PA-C 06/01/21 1225

## 2021-06-01 NOTE — Discharge Instructions (Addendum)
-  COVID and flu test were negative. -Symptoms overall consistent with a viral illness.  Treatment is symptom management.  Ibuprofen and Tylenol as needed for pain and headaches.  Plenty of fluids. -Zofran: one tablet under the tongue as needed for nausea -Return to the clinic or follow-up with primary care provider should symptoms worsen or not improve. -Would also return to clinic should the rash worsen or become fluid filled.

## 2022-06-05 ENCOUNTER — Ambulatory Visit
Admission: EM | Admit: 2022-06-05 | Discharge: 2022-06-05 | Disposition: A | Discharge status: Home / Self Care | Payer: BC Managed Care – PPO | Attending: Emergency Medicine | Admitting: Emergency Medicine

## 2022-06-05 ENCOUNTER — Encounter: Payer: Self-pay | Admitting: Emergency Medicine

## 2022-06-05 ENCOUNTER — Other Ambulatory Visit: Payer: Self-pay

## 2022-06-05 DIAGNOSIS — T7840XA Allergy, unspecified, initial encounter: Secondary | ICD-10-CM

## 2022-06-05 HISTORY — DX: Essential (primary) hypertension: I10

## 2022-06-05 MED ORDER — PREDNISONE 50 MG PO TABS: 50.0000 mg | ORAL_TABLET | Freq: Every day | ORAL | 0 refills | Status: AC

## 2022-06-05 MED ORDER — CETIRIZINE HCL 10 MG PO TABS: 10.0000 mg | ORAL_TABLET | Freq: Every day | ORAL | 2 refills | Status: AC

## 2022-06-05 NOTE — ED Provider Notes (Signed)
[facility] [place] [facility] - [place], [place]   Name: Melissa Best DOB: Jun 04, 1976 MRN: [id number] CSN: [id number] PCP: Rayetta Humphrey, MD  Arrival date and time:  06/05/22 1101  Chief Complaint:  Cough   NOTE: Prior to seeing the patient today, I have reviewed the triage nursing documentation and vital signs. Clinical staff has updated patient's PMH/PSHx, current medication list, and drug allergies/intolerances to ensure comprehensive history available to assist in medical decision making.   History:   HPI: Melissa Best is a 46 y.o. female who presents today with complaints of cough, ear pain and sore throat x5 days.  Patient states the symptoms started at the same time.  She has tried tea, naproxen and homeopathic medications to help with her symptoms and they have not resolved.  She denies fevers or chills.  She endorses body aches and general malaise.  No recent antibiotics.  No known COVID exposure.  At home COVID test were negative. She has blood pressure medication as prescribed, however she did not take it today.   Past Medical History:  Diagnosis Date   Hypertension    Lupus (HCC)     Past Surgical History:  Procedure Laterality Date   CESAREAN SECTION      Family History  Problem Relation Age of Onset   Healthy Mother    Healthy Father    Breast cancer Neg Hx     Social History   Tobacco Use   Smoking status: Never   Smokeless tobacco: Never  Vaping Use   Vaping Use: Never used  Substance Use Topics   Alcohol use: Never   Drug use: Never    There are no problems to display for this patient.   Home Medications:    No outpatient medications have been marked as taking for the 06/05/22 encounter [facility] Encounter).    Allergies:   Hydrocodone and Other  Review of Systems (ROS): Review of Systems  Constitutional:  Positive for fatigue. Negative for activity change, appetite change, chills and fever.  HENT:  Positive for  congestion, ear pain and sore throat. Negative for ear discharge, postnasal drip, sinus pain, sneezing and tinnitus.   Respiratory:  Positive for cough. Negative for wheezing.   Gastrointestinal:  Positive for nausea.  Musculoskeletal:  Positive for myalgias.  Neurological:  Negative for dizziness and weakness.     Vital Signs: Today's Vitals   06/05/22 1158 06/05/22 1202  BP:  (!) 158/95  Pulse:  93  Resp:  18  Temp:  98.2 F (36.8 C)  TempSrc:  Oral  SpO2:  98%  PainSc: 9      Physical Exam: Physical Exam Vitals and nursing note reviewed.  Constitutional:      Appearance: Normal appearance.  HENT:     Right Ear: Tympanic membrane normal.     Left Ear: Tympanic membrane normal.     Nose: Congestion present.     Right Sinus: Maxillary sinus tenderness and frontal sinus tenderness present.     Left Sinus: Maxillary sinus tenderness and frontal sinus tenderness present.     Mouth/Throat:     Pharynx: Oropharynx is clear. No posterior oropharyngeal erythema.  Eyes:     Extraocular Movements: Extraocular movements intact.     Pupils: Pupils are equal, round, and reactive to light.  Cardiovascular:     Rate and Rhythm: Normal rate and regular rhythm.     Pulses: Normal pulses.     Heart sounds: Normal heart sounds.  Pulmonary:  Effort: Pulmonary effort is normal.     Breath sounds: Normal breath sounds.  Skin:    General: Skin is warm and dry.  Neurological:     General: No focal deficit present.     Mental Status: She is alert and oriented to person, place, and time.  Psychiatric:        Mood and Affect: Mood normal.        Behavior: Behavior normal.      Urgent Care Treatments / Results:   LABS: PLEASE NOTE: all labs that were ordered this encounter are listed, however only abnormal results are displayed. Labs Reviewed - No data to display  EKG: -None  RADIOLOGY: No results found.  PROCEDURES: Procedures  MEDICATIONS RECEIVED THIS  VISIT: Medications - No data to display  PERTINENT CLINICAL COURSE NOTES/UPDATES:   Initial Impression / Assessment and Plan / Urgent Care Course:  Pertinent labs & imaging results that were available during my care of the patient were personally reviewed by me and considered in my medical decision making (see lab/imaging section of note for values and interpretations).  Melissa Best is a 46 y.o. female who presents to [facility] today with complaints of cough and ear pain, diagnosed with viral URI/sinusitis, and treated as such with the medications below. NP and patient reviewed discharge instructions below during visit.   Patient is well appearing overall in clinic today. She does not appear to be in any acute distress. Presenting symptoms (see HPI) and exam as documented above.   I have reviewed the follow up and strict return precautions for any new or worsening symptoms. Patient is aware of symptoms that would be deemed urgent/emergent, and would thus require further evaluation either here or in the emergency department. At the time of discharge, she verbalized understanding and consent with the discharge plan as it was reviewed with her. All questions were fielded by provider and/or clinic staff prior to patient discharge.    Final Clinical Impressions / Urgent Care Diagnoses:   Final diagnoses:  Allergy, initial encounter    New Prescriptions:  [place] Controlled Substance Registry consulted? Not Applicable  Meds ordered this encounter  Medications   cetirizine (ZYRTEC) 10 MG tablet    Sig: Take 1 tablet (10 mg total) by mouth daily.    Dispense:  90 tablet    Refill:  2   predniSONE (DELTASONE) 50 MG tablet    Sig: Take 1 tablet (50 mg total) by mouth daily with breakfast for 4 days.    Dispense:  4 tablet    Refill:  0      Discharge Instructions      You were seen for dizziness and near pain and are being treated for allergies.   -Prescription  for allergy medication cetirizine (Zyrtec) once refilled.  Take daily.  Use Flonase, then nasal spray for allergies, 2 times a day.  Make sure you aimed towards your sinuses. -You are being given a prescription for steroids. -Benadryl can be used at nighttime to help with postnasal drainage and aid with sleep. -Medications for your cough: Dexamethasone (Deltasone) and cough drops. -Increase hydration.  Take care, Dr. Sharlet Salina, NP-c      Recommended Follow up Care:  Patient encouraged to follow up with the following provider within the specified time frame, or sooner as dictated by the severity of her symptoms. As always, she was instructed that for any urgent/emergent care needs, she should seek care either here or in the emergency  department for more immediate evaluation.   Bailey Mech, DNP, NP-c   Bailey Mech, NP 06/05/22 1328

## 2022-06-05 NOTE — Discharge Instructions (Addendum)
You were seen for dizziness and near pain and are being treated for allergies.   -Prescription for allergy medication cetirizine (Zyrtec) once refilled.  Take daily.  Use Flonase, then nasal spray for allergies, 2 times a day.  Make sure you aimed towards your sinuses. -You are being given a prescription for steroids. -Benadryl can be used at nighttime to help with postnasal drainage and aid with sleep. -Medications for your cough: Dexamethasone (Deltasone) and cough drops. -Increase hydration.  Take care, Dr. Sharlet Salina, NP-c

## 2022-06-05 NOTE — ED Triage Notes (Signed)
Left ear pain, dizzy, headache, sore throat and cough for one day

## 2022-09-09 IMAGING — MG DIGITAL SCREENING BILAT W/ TOMO W/ CAD
8 series · 8 of 24 positions shown · non-contrast
Comparison: Previous exams.

CLINICAL DATA: Screening.

EXAM:
DIGITAL SCREENING BILATERAL MAMMOGRAM WITH TOMO AND CAD

[R MLO synth-2D]
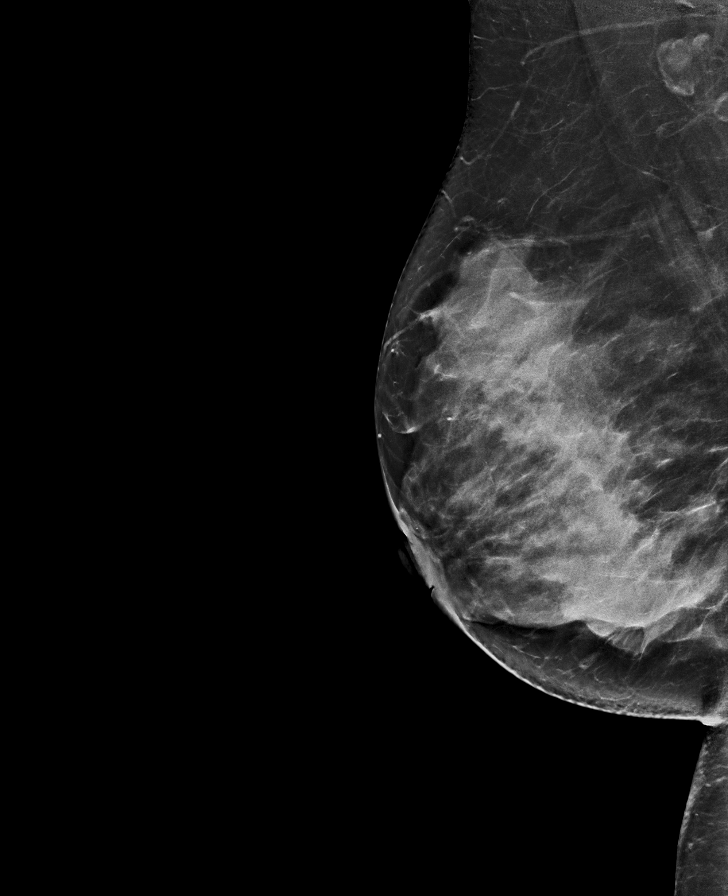

[L MLO synth-2D]
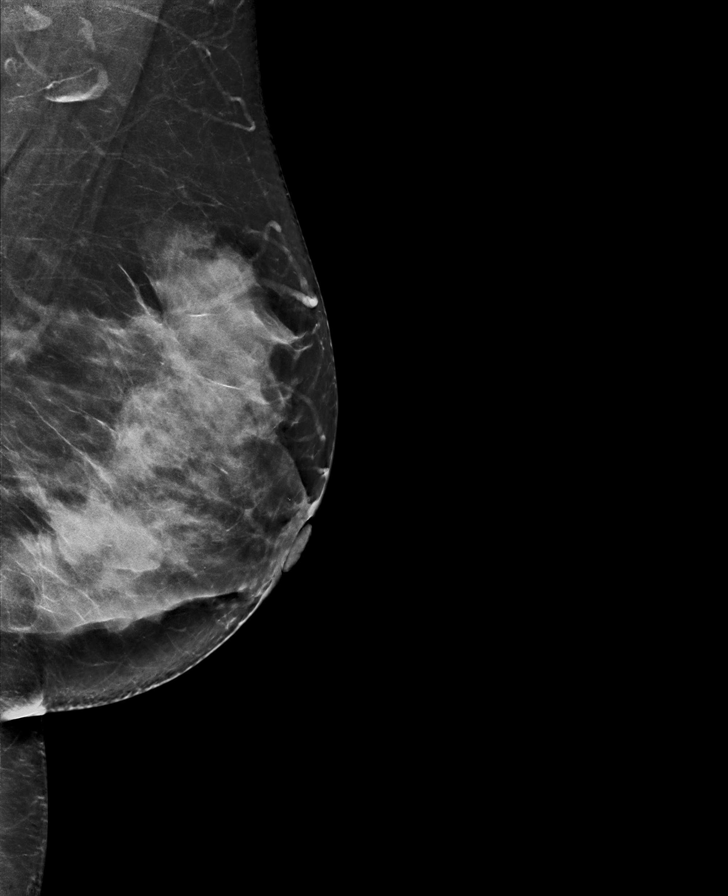

[R CC synth-2D]
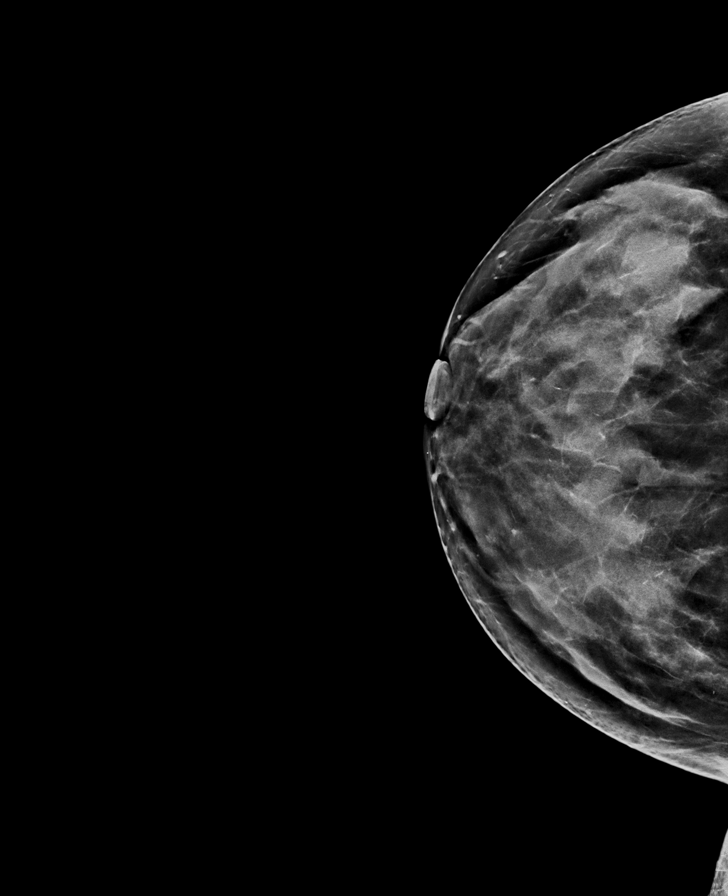

[L CC synth-2D]
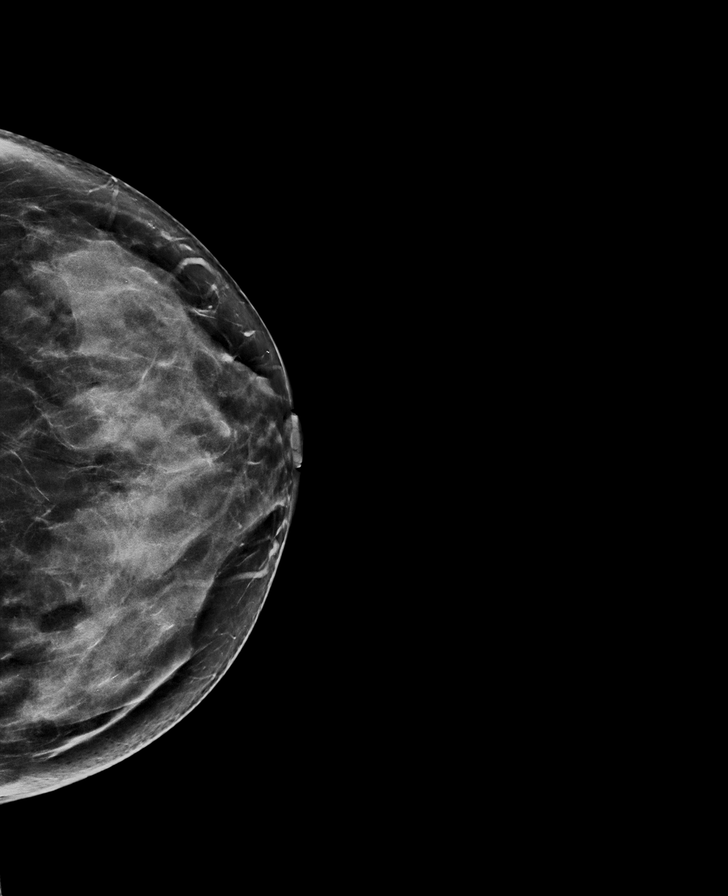

[L MLO tomo · tomo slice 41/80.0]
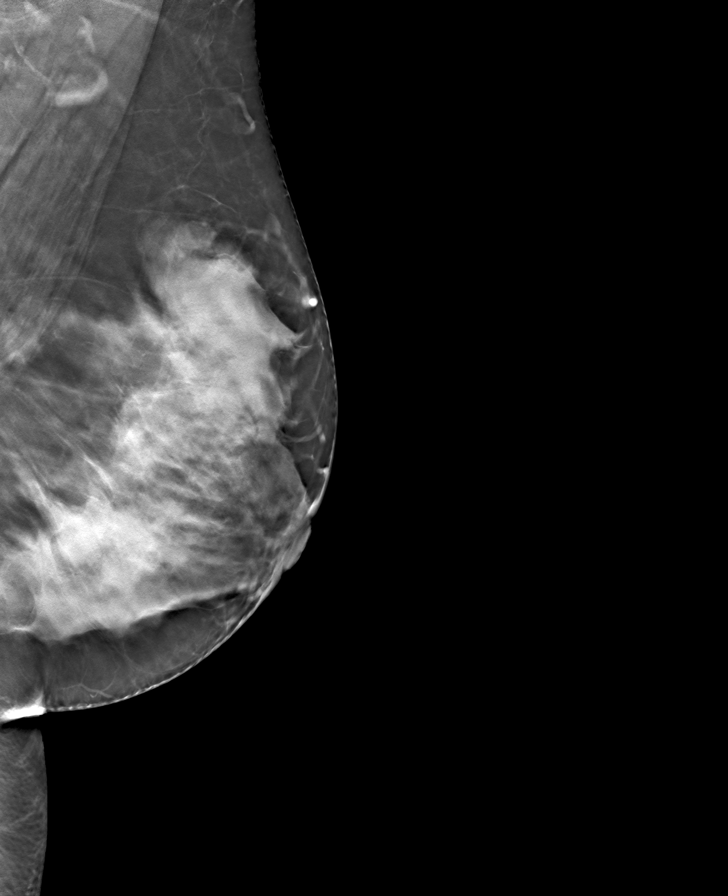

[R MLO tomo · tomo slice 39/78.0]
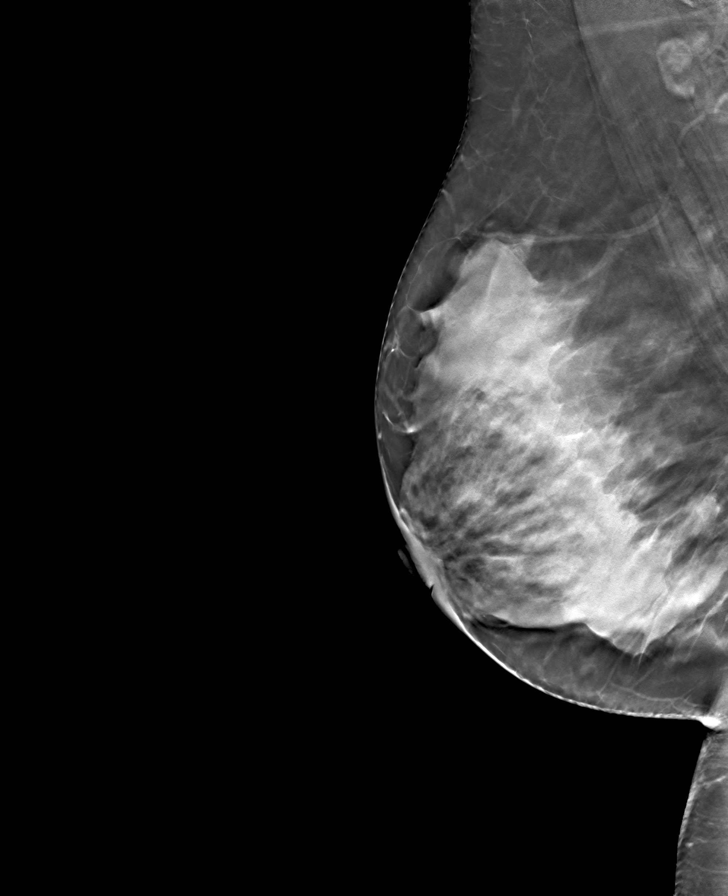

[L CC tomo · tomo slice 35/70.0]
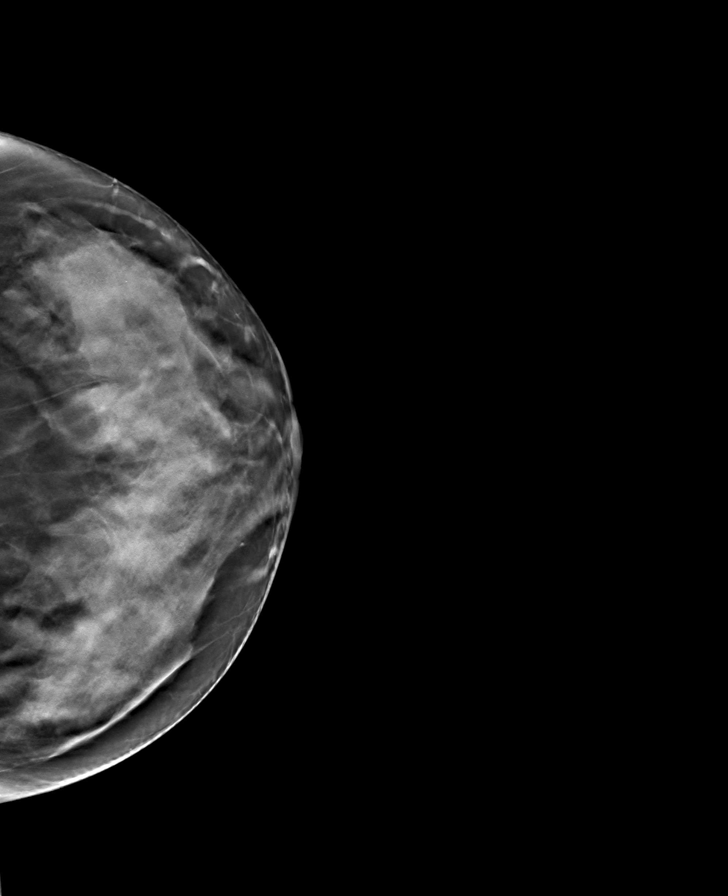

[R CC tomo · tomo slice 35/69.0]
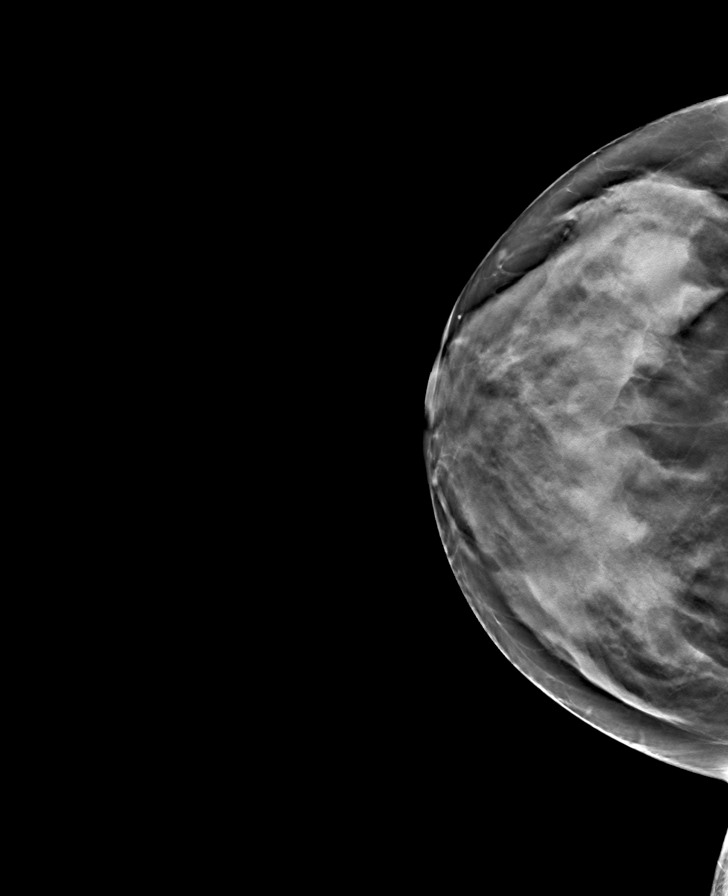

[8 of 24 positions shown; findings below may reference images not displayed]

ACR Breast Density Category d: The breast tissue is extremely dense,
which lowers the sensitivity of mammography.
FINDINGS: In the left breast, a possible asymmetry warrants further
evaluation. In the right breast, no findings suspicious for
malignancy. Images were processed with CAD.
IMPRESSION: Further evaluation is suggested for possible asymmetry in the left
breast.

RECOMMENDATION:
Diagnostic mammogram and possibly ultrasound of the left breast.
(Code:[id number])

The patient will be contacted regarding the findings, and additional
imaging will be scheduled.

BI-RADS CATEGORY  0: Incomplete. Need additional imaging evaluation
and/or prior mammograms for comparison.

## 2023-01-15 ENCOUNTER — Other Ambulatory Visit (HOSPITAL_BASED_OUTPATIENT_CLINIC_OR_DEPARTMENT_OTHER): Payer: Self-pay

## 2023-01-20 IMAGING — CR DG CHEST 2V
2 series · 2 of 2 positions shown · non-contrast
Comparison: 10/09/2008

CLINICAL DATA: History of [id number] positivity with persistent
cough, initial encounter

EXAM:
CHEST - 2 VIEW

[chest pa]
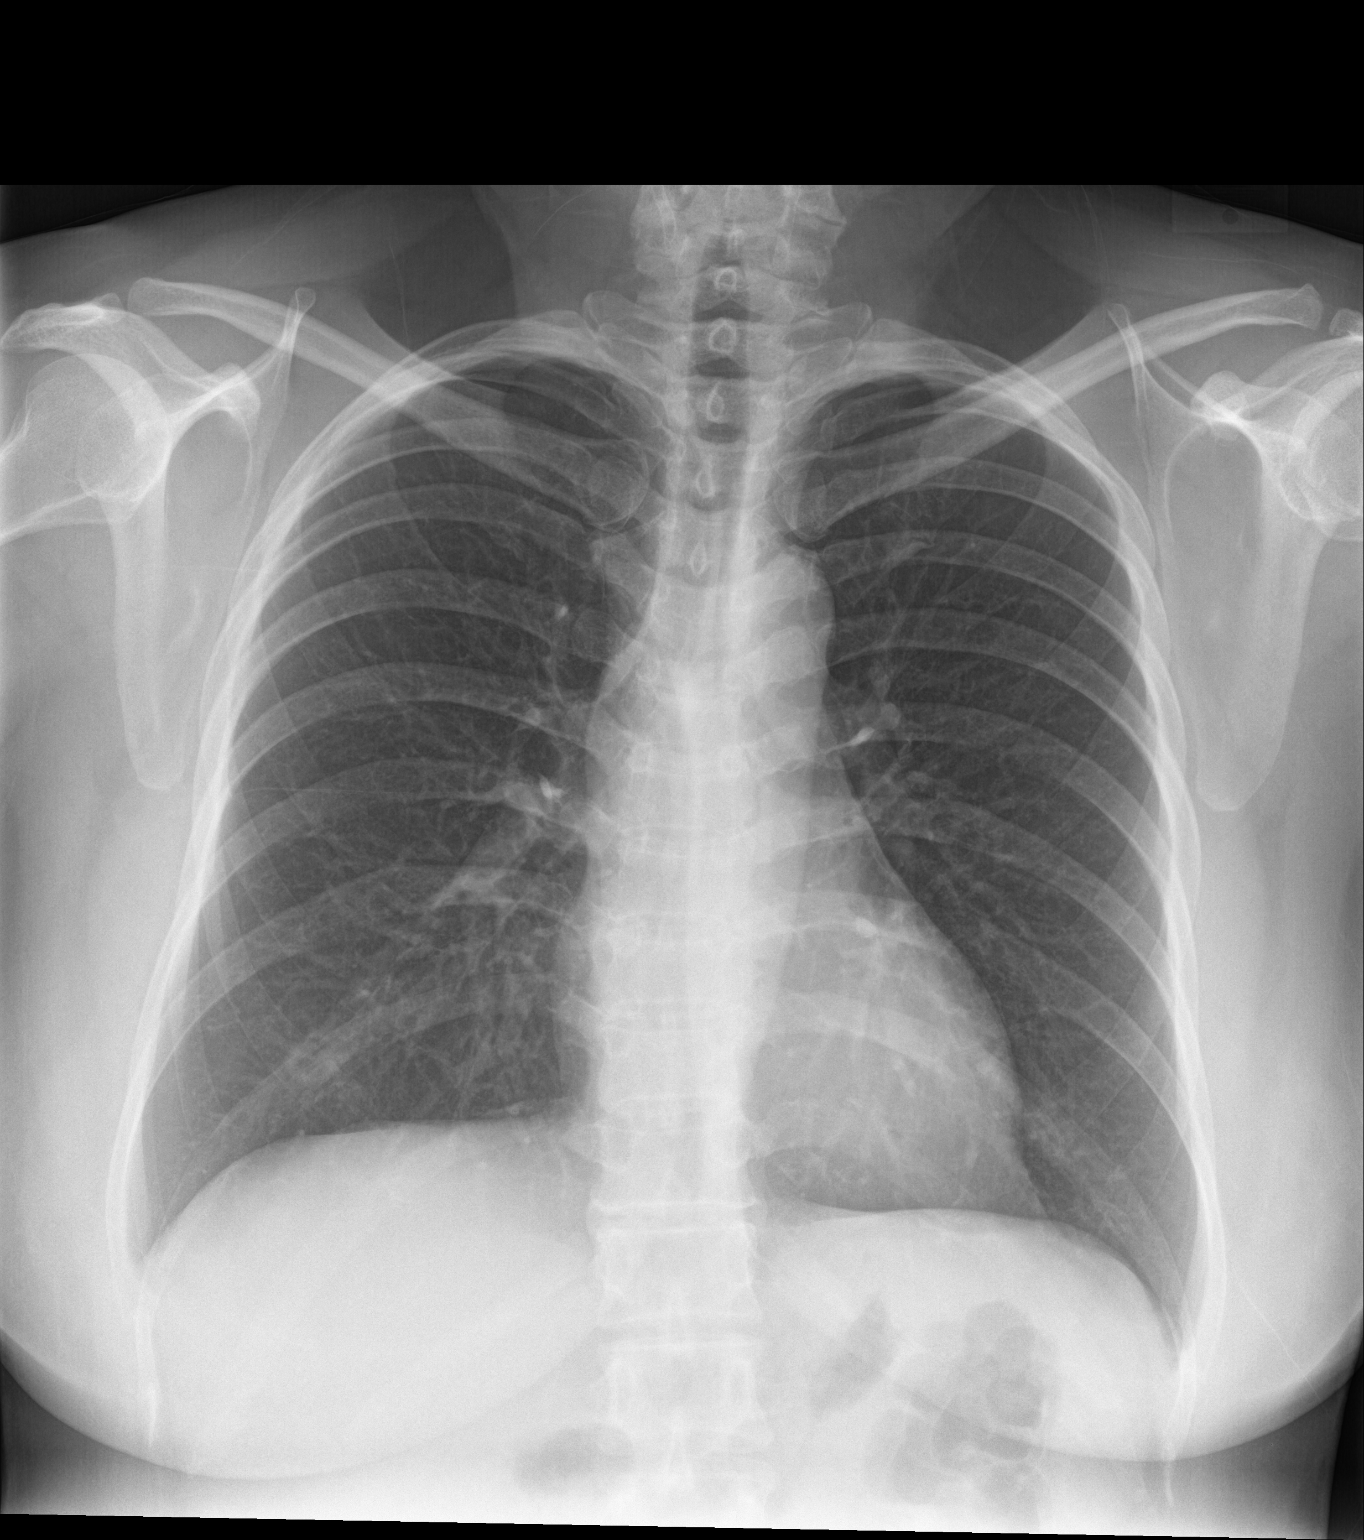

[chest lat]
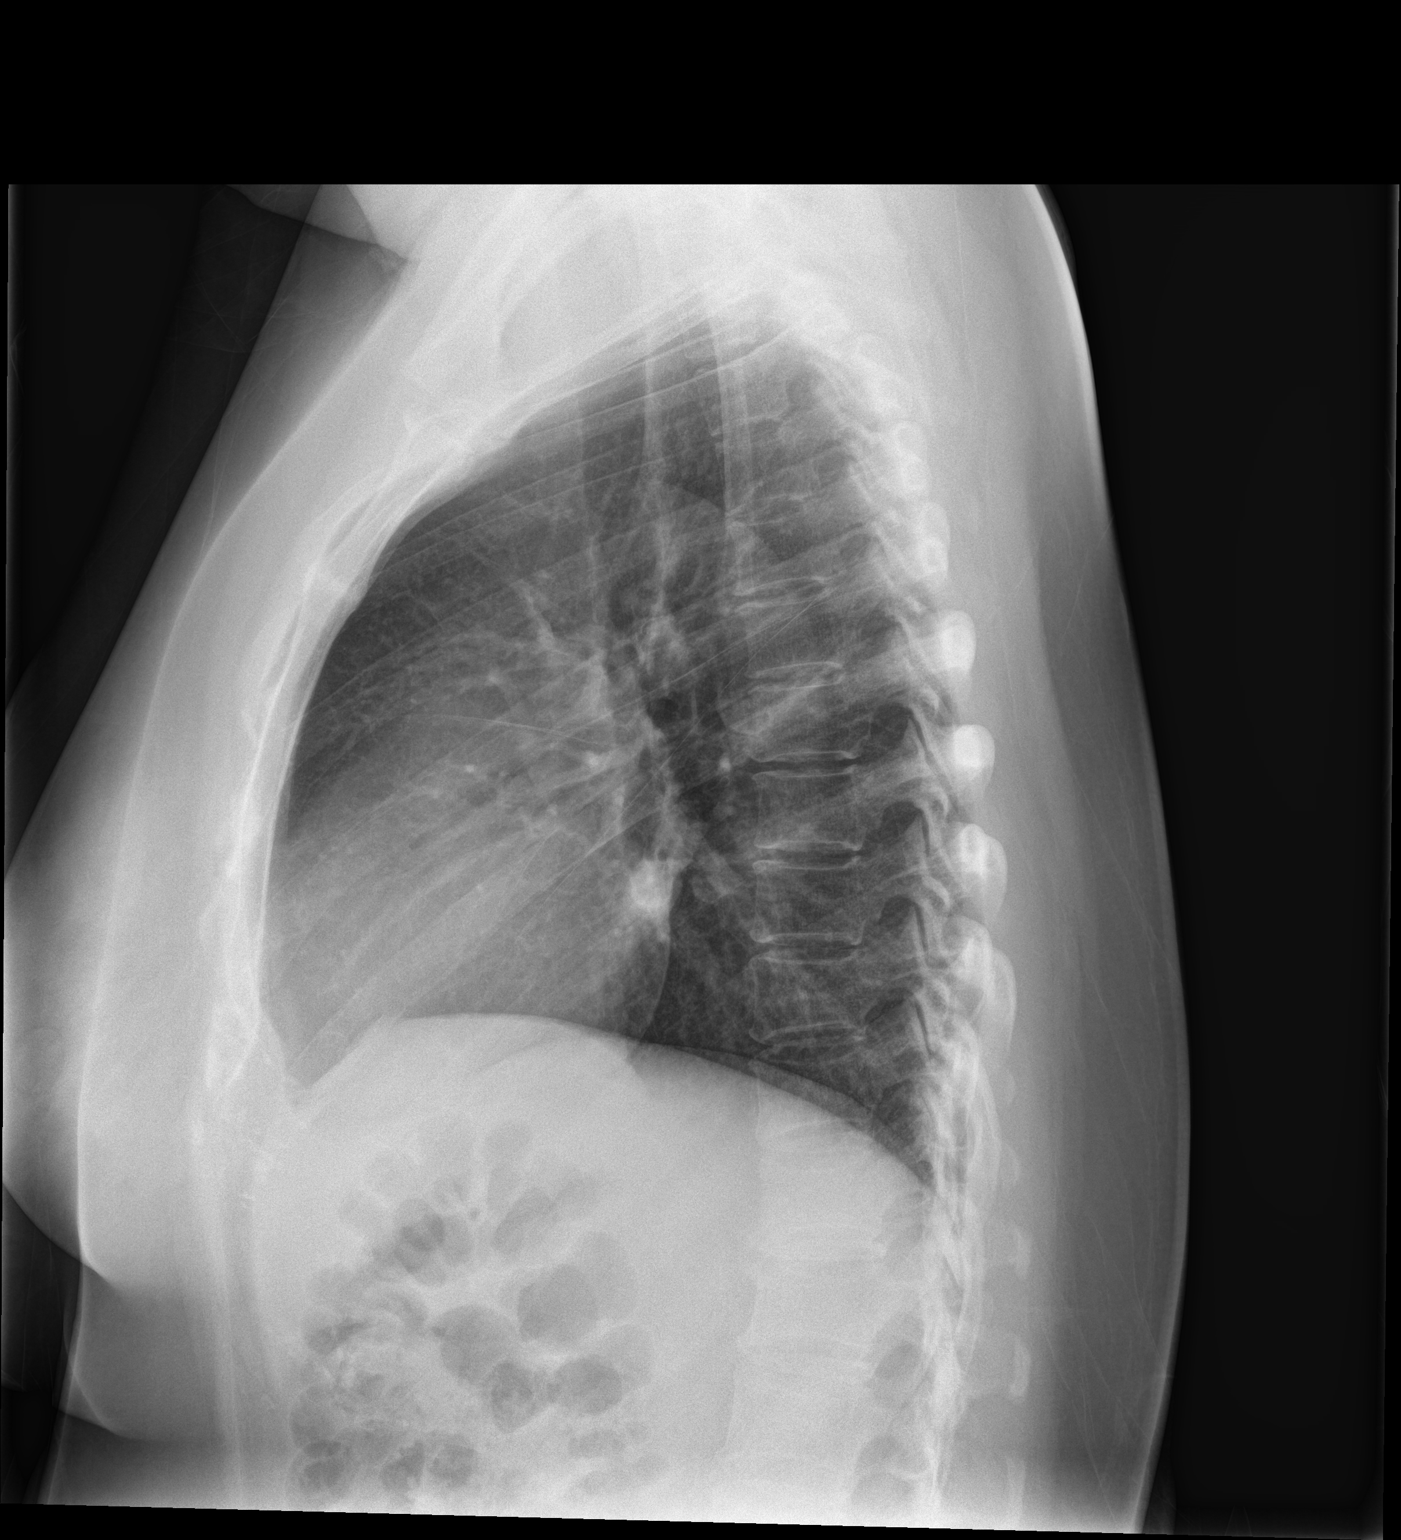

[2 of 2 positions shown; findings below may reference images not displayed]

FINDINGS: The heart size and mediastinal contours are within normal limits.
Both lungs are clear. The visualized skeletal structures are
unremarkable.
IMPRESSION: No active cardiopulmonary disease.

## 2023-06-03 ENCOUNTER — Other Ambulatory Visit: Payer: Self-pay

## 2023-06-03 ENCOUNTER — Emergency Department: Payer: BC Managed Care – PPO

## 2023-06-03 ENCOUNTER — Emergency Department
Admission: EM | Admit: 2023-06-03 | Discharge: 2023-06-04 | Disposition: A | Discharge status: Home / Self Care | Payer: BC Managed Care – PPO | Source: Home / Self Care | Attending: Emergency Medicine | Admitting: Emergency Medicine

## 2023-06-03 DIAGNOSIS — E119 Type 2 diabetes mellitus without complications: Secondary | ICD-10-CM | POA: Insufficient documentation

## 2023-06-03 DIAGNOSIS — Z1152 Encounter for screening for COVID-19: Secondary | ICD-10-CM | POA: Insufficient documentation

## 2023-06-03 DIAGNOSIS — Z79899 Other long term (current) drug therapy: Secondary | ICD-10-CM | POA: Insufficient documentation

## 2023-06-03 DIAGNOSIS — R4182 Altered mental status, unspecified: Secondary | ICD-10-CM | POA: Insufficient documentation

## 2023-06-03 DIAGNOSIS — E876 Hypokalemia: Secondary | ICD-10-CM | POA: Insufficient documentation

## 2023-06-03 DIAGNOSIS — I10 Essential (primary) hypertension: Secondary | ICD-10-CM | POA: Diagnosis not present

## 2023-06-03 LAB — COMPREHENSIVE METABOLIC PANEL
ALT: 38 U/L (ref 0–44)
AST: 24 U/L (ref 15–41)
Albumin: 4.5 g/dL (ref 3.5–5.0)
Alkaline Phosphatase: 53 U/L (ref 38–126)
Anion gap: 12 (ref 5–15)
BUN: 22 mg/dL — ABNORMAL HIGH (ref 6–20)
CO2: 22 mmol/L (ref 22–32)
Calcium: 9.5 mg/dL (ref 8.9–10.3)
Chloride: 102 mmol/L (ref 98–111)
Creatinine, Ser: 0.9 mg/dL (ref 0.44–1.00)
GFR, Estimated: >60 mL/min (ref >60)
Glucose, Bld: 139 mg/dL — ABNORMAL HIGH (ref 70–99)
Potassium: 2.9 mmol/L — ABNORMAL LOW (ref 3.5–5.1)
Sodium: 136 mmol/L (ref 135–145)
Total Bilirubin: 0.6 mg/dL (ref 0.3–1.2)
Total Protein: 7.7 g/dL (ref 6.5–8.1)

## 2023-06-03 LAB — CBC WITH DIFFERENTIAL/PLATELET
Abs Immature Granulocytes: 0.02 10*3/uL (ref 0.00–0.07)
Basophils Absolute: 0 10*3/uL (ref 0.0–0.1)
Basophils Relative: 1 %
Eosinophils Absolute: 0.1 10*3/uL (ref 0.0–0.5)
Eosinophils Relative: 1 %
HCT: 43.1 % (ref 36.0–46.0)
Hemoglobin: 14.2 g/dL (ref 12.0–15.0)
Immature Granulocytes: 0 %
Lymphocytes Relative: 34 %
Lymphs Abs: 2.1 10*3/uL (ref 0.7–4.0)
MCH: 25.7 pg — ABNORMAL LOW (ref 26.0–34.0)
MCHC: 32.9 g/dL (ref 30.0–36.0)
MCV: 78.1 fL — ABNORMAL LOW (ref 80.0–100.0)
Monocytes Absolute: 0.4 10*3/uL (ref 0.1–1.0)
Monocytes Relative: 6 %
Neutro Abs: 3.6 10*3/uL (ref 1.7–7.7)
Neutrophils Relative %: 58 %
Platelets: 295 10*3/uL (ref 150–400)
RBC: 5.52 MIL/uL — ABNORMAL HIGH (ref 3.87–5.11)
RDW: 12.9 % (ref 11.5–15.5)
WBC: 6.3 10*3/uL (ref 4.0–10.5)
nRBC: 0 % (ref 0.0–0.2)

## 2023-06-03 MED ORDER — ONDANSETRON HCL 4 MG/2ML IJ SOLN
4.0000 mg | Freq: Once | INTRAMUSCULAR | Status: AC
  Administered 2023-06-04: 4 mg via INTRAVENOUS
  Filled 2023-06-03: qty 2

## 2023-06-03 MED ORDER — POTASSIUM CHLORIDE 10 MEQ/100ML IV SOLN
10.0000 meq | Freq: Once | INTRAVENOUS | Status: AC
  Administered 2023-06-04: 10 meq via INTRAVENOUS
  Filled 2023-06-03: qty 100

## 2023-06-03 MED ORDER — SODIUM CHLORIDE 0.9 % IV BOLUS
1000.0000 mL | Freq: Once | INTRAVENOUS | Status: AC
  Administered 2023-06-04: 1000 mL via INTRAVENOUS

## 2023-06-03 NOTE — ED Triage Notes (Signed)
Patient brought in by EMS for sudden onset of dizziness and "repeating herself"; She reports headache and dizziness; She is able to answer questions, follow commands, PERRLA, and is able to move all extremities; CBG 119

## 2023-06-03 NOTE — ED Provider Notes (Signed)
[facility] Provider Note    Event Date/Time   First MD Initiated Contact with Patient 06/03/23 2257     (approximate)   History   Altered Mental Status (Patient brought in by EMS for sudden onset of dizziness and "repeating herself"; She reports headache and dizziness; She is able to answer questions, follow commands, PERRLA, and is able to move all extremities; CBG 119)   HPI  Melissa Best is a 47 y.o. female brought to the ED via EMS from home with a chief complaint of dizziness and repeating herself.  Family states they are watching a football game around 8:30 PM when patient complained of dizziness (sensation of room spinning as well as lightheadedness) and seemed confused, repeating herself which is unusual for patient.  Family denies slurred speech, facial droop.  Patient denies extremity weakness/numbness/tingling.  Reports mild global headache.  Denies recent fever/chills, cough, chest pain, shortness of breath, abdominal pain, nausea, vomiting or dysuria.  Denies recent trauma or head injury.     Past Medical History   Past Medical History:  Diagnosis Date   Hypertension    Lupus (HCC)      Active Problem List  There are no problems to display for this patient.    Past Surgical History   Past Surgical History:  Procedure Laterality Date   CESAREAN SECTION       Home Medications   Prior to Admission medications   Medication Sig Start Date End Date Taking? Authorizing Provider  benzonatate (TESSALON) 200 MG capsule Take 1 capsule (200 mg total) by mouth 3 (three) times daily as needed for cough. Patient not taking: Reported on 06/05/2022 11/04/20   Sudie Grumbling, NP  Calcium Carbonate-Vitamin D 600-200 MG-UNIT TABS Take by mouth. 05/17/19 06/01/21  [provider]  cetirizine (ZYRTEC) 10 MG tablet Take 1 tablet (10 mg total) by mouth daily. 06/05/22 12/07/24  Bailey Mech, NP  cyanocobalamin 1000 MCG tablet Take  by mouth. 01/31/19   [provider]  hydrochlorothiazide (HYDRODIURIL) 25 MG tablet Take by mouth. 05/29/19 06/01/21  [provider]  hydroxychloroquine (PLAQUENIL) 200 MG tablet Take by mouth. 05/17/19   [provider]  ipratropium (ATROVENT) 0.03 % nasal spray Place into the nose. 08/29/19 08/28/20  [provider]  magic mouthwash SOLN Take 10 mLs by mouth 4 (four) times daily as needed (throat pain). Patient not taking: Reported on 06/05/2022 11/04/20   Sudie Grumbling, NP  naproxen (NAPROSYN) 500 MG tablet Take by mouth. 10/09/19   [provider]  ondansetron (ZOFRAN ODT) 8 MG disintegrating tablet Take 1 tablet (8 mg total) by mouth every 8 (eight) hours as needed for nausea or vomiting. Patient not taking: Reported on 06/05/2022 06/01/21   Candis Schatz, PA-C  pantoprazole (PROTONIX) 40 MG tablet Take by mouth. 09/02/20 09/02/21  [provider]  SUMAtriptan (IMITREX) 50 MG tablet TAKE 1 TABLET BY MOUTH ONCE AS NEEDED FOR MIGRAINE. MAY TAKE A 2ND DOSE AFTER 2 HOURS IF NEEDED. 05/29/19   [provider]     Allergies  Hydrocodone and Other   Family History   Family History  Problem Relation Age of Onset   Healthy Mother    Healthy Father    Breast cancer Neg Hx      Physical Exam  Triage Vital Signs: ED Triage Vitals  Encounter Vitals Group     BP 06/03/23 2222 (!) 179/101     Systolic BP Percentile --  Diastolic BP Percentile --      Pulse Rate 06/03/23 2222 89     Resp 06/03/23 2222 19     Temp 06/03/23 2222 98.2 F (36.8 C)     Temp Source 06/03/23 2222 Oral     SpO2 06/03/23 2222 100 %     Weight 06/03/23 2223 159 lb 12.8 oz (72.5 kg)     Height 06/03/23 2223 4\' 11"  (1.499 m)     Head Circumference --      Peak Flow --      Pain Score --      Pain Loc --      Pain Education --      Exclude from Growth Chart --     Updated Vital Signs: BP (!) 169/99   Pulse 92   Temp 98.2 F (36.8 C) (Oral)    Resp 17   Ht 4\' 11"  (1.499 m)   Wt 72.5 kg   LMP  (LMP Unknown)   SpO2 100%   BMI 32.28 kg/m    General: Awake, mild distress.  CV:  RRR.  Good peripheral perfusion.  Resp:  Normal effort.  CTAB. Abd:  Nontender.  No distention.  Other:  PERRL.  EOMI.  No carotid bruits.  Supple neck without meningismus.  Mild psychomotor retardation, otherwise alert and oriented x 3.  CN II-XII roughly intact.  5/5 motor strength and sensation all extremities.  M AE x 4.   ED Results / Procedures / Treatments  Labs (all labs ordered are listed, but only abnormal results are displayed) Labs Reviewed  COMPREHENSIVE METABOLIC PANEL - Abnormal; Notable for the following components:      Result Value   Potassium 2.9 (*)    Glucose, Bld 139 (*)    BUN 22 (*)    All other components within normal limits  CBC WITH DIFFERENTIAL/PLATELET - Abnormal; Notable for the following components:   RBC 5.52 (*)    MCV 78.1 (*)    MCH 25.7 (*)    All other components within normal limits  URINALYSIS, ROUTINE W REFLEX MICROSCOPIC - Abnormal; Notable for the following components:   Color, Urine COLORLESS (*)    APPearance CLEAR (*)    Specific Gravity, Urine 1.002 (*)    All other components within normal limits  ACETAMINOPHEN LEVEL - Abnormal; Notable for the following components:   Acetaminophen (Tylenol), Serum <10 (*)    All other components within normal limits  SALICYLATE LEVEL - Abnormal; Notable for the following components:   Salicylate Lvl <7.0 (*)    All other components within normal limits  RESP PANEL BY RT-PCR (RSV, FLU A&B, COVID)  [id number]  URINE DRUG SCREEN, QUALITATIVE ([facility] ONLY)  ETHANOL  TROPONIN I (HIGH SENSITIVITY)     EKG  ED ECG REPORT I, Marijean Montanye J, the attending physician, personally viewed and interpreted this ECG.   Date: 06/03/2023  EKG Time: 2224  Rate: 88  Rhythm: normal sinus rhythm  Axis: Normal  Intervals:none  ST&T Change: Nonspecific    RADIOLOGY I have  independently visualized and interpreted patient's imaging studies as well as noted the radiology interpretation:  Chest x-ray: No acute cardiopulmonary process  CT head: No ICH  MRI brain: Negative  Official radiology report(s): CT Head Wo Contrast  Result Date: 06/04/2023 CLINICAL DATA:  Mental status change, unknown cause.  Dizziness. EXAM: CT HEAD WITHOUT CONTRAST TECHNIQUE: Contiguous axial images were obtained from the base of the skull through the vertex without intravenous contrast.  RADIATION DOSE REDUCTION: This exam was performed according to the departmental dose-optimization program which includes automated exposure control, adjustment of the mA and/or kV according to patient size and/or use of iterative reconstruction technique. COMPARISON:  None Available. FINDINGS: Brain: No acute intracranial abnormality. Specifically, no hemorrhage, hydrocephalus, mass lesion, acute infarction, or significant intracranial injury. Vascular: No hyperdense vessel or unexpected calcification. Skull: No acute calvarial abnormality. Sinuses/Orbits: No acute findings Other: None IMPRESSION: Normal study. Electronically Signed   By: Charlett Nose M.D.   On: 06/04/2023 00:20     PROCEDURES:  Critical Care performed: No  .1-3 Lead EKG Interpretation  Performed by: Irean Hong, MD Authorized by: Irean Hong, MD     Interpretation: normal     ECG rate:  85   ECG rate assessment: normal     Rhythm: sinus rhythm     Ectopy: none     Conduction: normal   Comments:     Patient placed on cardiac monitor to evaluate for arrhythmias    MEDICATIONS ORDERED IN ED: Medications  potassium chloride 10 mEq in 100 mL IVPB (10 mEq Intravenous New Bag/Given 06/04/23 0229)  sodium chloride 0.9 % bolus 1,000 mL (1,000 mLs Intravenous New Bag/Given 06/04/23 0027)  ondansetron (ZOFRAN) injection 4 mg (4 mg Intravenous Given 06/04/23 0028)  potassium chloride 10 mEq in 100 mL IVPB (10 mEq Intravenous New  Bag/Given 06/04/23 0029)  diazepam (VALIUM) injection 2 mg (2 mg Intravenous Given 06/04/23 0221)  ondansetron (ZOFRAN) injection 4 mg (4 mg Intravenous Given 06/04/23 0222)     IMPRESSION / MDM / ASSESSMENT AND PLAN / ED COURSE  I reviewed the triage vital signs and the nursing notes.                             46 year old female presenting with altered mental status. Differential diagnosis includes, but is not limited to, alcohol, illicit or prescription medications, or other toxic ingestion; intracranial pathology such as stroke or intracerebral hemorrhage; fever or infectious causes including sepsis; hypoxemia and/or hypercarbia; uremia; trauma; endocrine related disorders such as diabetes, hypoglycemia, and thyroid-related diseases; hypertensive encephalopathy; etc. I personally reviewed patient's records and note a rheumatology office visit for lupus on 04/12/2023.  Patient's presentation is most consistent with acute presentation with potential threat to life or bodily function.  The patient is on the cardiac monitor to evaluate for evidence of arrhythmia and/or significant heart rate changes.  Laboratory results demonstrate normal WBC 6.3, mild hypokalemia with potassium 2.9.  Will add troponin, toxicological lab work, respiratory panel, chest x-ray, urine and CT head.  Administer IV fluid hydration, IV Zofran for nausea, IV potassium replacement.  Will reassess.  Clinical Course as of 06/04/23 6213  Sat Jun 04, 2023  0865 Patient looks better but states she remains nauseous and dizzy.  Updated patient and her family of CT scan results.  Will proceed to MRI brain. [JS]  0321 MRI brain negative.  Will discharge home with neurology follow-up.  Strict return precautions given.  Patient and family members verbalized understanding agree with plan of care. [JS]    Clinical Course User Index [JS] Irean Hong, MD     FINAL CLINICAL IMPRESSION(S) / ED DIAGNOSES   Final diagnoses:  Altered  mental status, unspecified altered mental status type  Hypokalemia     Rx / DC Orders   ED Discharge Orders     None  Note:  This document was prepared using Dragon voice recognition software and may include unintentional dictation errors.   Irean Hong, MD 06/04/23 [phone]

## 2023-06-04 ENCOUNTER — Emergency Department: Payer: BC Managed Care – PPO

## 2023-06-04 ENCOUNTER — Encounter: Payer: Self-pay | Admitting: Radiology

## 2023-06-04 LAB — URINE DRUG SCREEN, QUALITATIVE (ARMC ONLY)
Amphetamines, Ur Screen: NOT DETECTED
Barbiturates, Ur Screen: NOT DETECTED
Benzodiazepine, Ur Scrn: NOT DETECTED
Cannabinoid 50 Ng, Ur ~~LOC~~: NOT DETECTED
Cocaine Metabolite,Ur ~~LOC~~: NOT DETECTED
MDMA (Ecstasy)Ur Screen: NOT DETECTED
Methadone Scn, Ur: NOT DETECTED
Opiate, Ur Screen: NOT DETECTED
Phencyclidine (PCP) Ur S: NOT DETECTED
Tricyclic, Ur Screen: NOT DETECTED

## 2023-06-04 LAB — URINALYSIS, ROUTINE W REFLEX MICROSCOPIC
Bilirubin Urine: NEGATIVE
Glucose, UA: NEGATIVE mg/dL
Hgb urine dipstick: NEGATIVE
Ketones, ur: NEGATIVE mg/dL
Leukocytes,Ua: NEGATIVE
Nitrite: NEGATIVE
Protein, ur: NEGATIVE mg/dL
Specific Gravity, Urine: 1.002 — ABNORMAL LOW (ref 1.005–1.030)
pH: 7 (ref 5.0–8.0)

## 2023-06-04 LAB — RESP PANEL BY RT-PCR (RSV, FLU A&B, COVID)  RVPGX2
Influenza A by PCR: NEGATIVE
Influenza B by PCR: NEGATIVE
Resp Syncytial Virus by PCR: NEGATIVE
SARS Coronavirus 2 by RT PCR: NEGATIVE

## 2023-06-04 LAB — TROPONIN I (HIGH SENSITIVITY): Troponin I (High Sensitivity): 6 ng/L (ref <18)

## 2023-06-04 LAB — ETHANOL: Alcohol, Ethyl (B): <10 mg/dL (ref <10)

## 2023-06-04 LAB — ACETAMINOPHEN LEVEL: Acetaminophen (Tylenol), Serum: <10 ug/mL — ABNORMAL LOW (ref 10–30)

## 2023-06-04 LAB — SALICYLATE LEVEL: Salicylate Lvl: <7 mg/dL — ABNORMAL LOW (ref 7.0–30.0)

## 2023-06-04 MED ORDER — DIAZEPAM 5 MG/ML IJ SOLN
2.0000 mg | Freq: Once | INTRAMUSCULAR | Status: AC
  Administered 2023-06-04: 2 mg via INTRAVENOUS
  Filled 2023-06-04: qty 2

## 2023-06-04 MED ORDER — ONDANSETRON HCL 4 MG/2ML IJ SOLN
4.0000 mg | Freq: Once | INTRAMUSCULAR | Status: AC
  Administered 2023-06-04: 4 mg via INTRAVENOUS
  Filled 2023-06-04: qty 2

## 2023-06-04 NOTE — Discharge Instructions (Addendum)
Return to the ER for worsening symptoms, persistent vomiting, lethargy or other concerns. °

## 2023-06-04 NOTE — ED Notes (Signed)
Pt is in MRI  

## 2024-11-07 ENCOUNTER — Encounter (INDEPENDENT_AMBULATORY_CARE_PROVIDER_SITE_OTHER): Payer: Self-pay

## 2024-11-07 ENCOUNTER — Encounter (INDEPENDENT_AMBULATORY_CARE_PROVIDER_SITE_OTHER): Payer: Self-pay | Admitting: Vascular Surgery

## 2024-11-27 ENCOUNTER — Encounter (INDEPENDENT_AMBULATORY_CARE_PROVIDER_SITE_OTHER): Payer: Self-pay | Admitting: Vascular Surgery

## 2024-11-27 ENCOUNTER — Encounter (INDEPENDENT_AMBULATORY_CARE_PROVIDER_SITE_OTHER): Payer: Self-pay
